# Patient Record
Sex: Female | Born: 1945 | Race: White | Hispanic: No | Marital: Married | Smoking: Never smoker
Health system: Southern US, Community
[De-identification: ages and names within clinical notes are randomized; demographics above are authoritative.]

## PROBLEM LIST (undated history)

## (undated) DIAGNOSIS — M1990 Unspecified osteoarthritis, unspecified site: Secondary | ICD-10-CM

## (undated) DIAGNOSIS — I482 Chronic atrial fibrillation, unspecified: Secondary | ICD-10-CM

## (undated) DIAGNOSIS — E790 Hyperuricemia without signs of inflammatory arthritis and tophaceous disease: Secondary | ICD-10-CM

## (undated) DIAGNOSIS — I10 Essential (primary) hypertension: Secondary | ICD-10-CM

## (undated) DIAGNOSIS — E6609 Other obesity due to excess calories: Secondary | ICD-10-CM

## (undated) DIAGNOSIS — E785 Hyperlipidemia, unspecified: Secondary | ICD-10-CM

## (undated) DIAGNOSIS — E039 Hypothyroidism, unspecified: Secondary | ICD-10-CM

## (undated) DIAGNOSIS — I809 Phlebitis and thrombophlebitis of unspecified site: Secondary | ICD-10-CM

## (undated) HISTORY — DX: Phlebitis and thrombophlebitis of unspecified site: I80.9

## (undated) HISTORY — DX: Other obesity due to excess calories: E66.09

## (undated) HISTORY — DX: Essential (primary) hypertension: I10

## (undated) HISTORY — DX: Unspecified osteoarthritis, unspecified site: M19.90

## (undated) HISTORY — DX: Chronic atrial fibrillation, unspecified: I48.20

## (undated) HISTORY — DX: Hyperlipidemia, unspecified: E78.5

## (undated) HISTORY — DX: Hyperuricemia without signs of inflammatory arthritis and tophaceous disease: E79.0

## (undated) HISTORY — DX: Hypothyroidism, unspecified: E03.9

---

## 1974-08-09 HISTORY — PX: OOPHORECTOMY: SHX86

## 1980-08-09 HISTORY — PX: TUBAL LIGATION: SHX77

## 1987-08-10 DIAGNOSIS — I8290 Acute embolism and thrombosis of unspecified vein: Secondary | ICD-10-CM

## 1987-08-10 HISTORY — DX: Acute embolism and thrombosis of unspecified vein: I82.90

## 1998-06-13 ENCOUNTER — Other Ambulatory Visit: Admission: RE | Admit: 1998-06-13 | Discharge: 1998-06-13 | Payer: Self-pay | Admitting: Obstetrics and Gynecology

## 1998-08-26 ENCOUNTER — Ambulatory Visit (HOSPITAL_COMMUNITY): Admission: RE | Admit: 1998-08-26 | Discharge: 1998-08-26 | Payer: Self-pay | Admitting: Obstetrics and Gynecology

## 1999-06-05 ENCOUNTER — Encounter: Payer: Self-pay | Admitting: Obstetrics and Gynecology

## 1999-06-05 ENCOUNTER — Encounter: Admission: RE | Admit: 1999-06-05 | Discharge: 1999-06-05 | Payer: Self-pay | Admitting: Obstetrics and Gynecology

## 1999-08-12 ENCOUNTER — Other Ambulatory Visit: Admission: RE | Admit: 1999-08-12 | Discharge: 1999-08-12 | Payer: Self-pay | Admitting: Obstetrics and Gynecology

## 2000-06-07 ENCOUNTER — Encounter: Admission: RE | Admit: 2000-06-07 | Discharge: 2000-06-07 | Payer: Self-pay | Admitting: Obstetrics and Gynecology

## 2000-06-07 ENCOUNTER — Encounter: Payer: Self-pay | Admitting: Obstetrics and Gynecology

## 2000-07-07 ENCOUNTER — Other Ambulatory Visit: Admission: RE | Admit: 2000-07-07 | Discharge: 2000-07-07 | Payer: Self-pay | Admitting: Obstetrics and Gynecology

## 2001-05-09 ENCOUNTER — Other Ambulatory Visit: Admission: RE | Admit: 2001-05-09 | Discharge: 2001-05-09 | Payer: Self-pay | Admitting: Obstetrics and Gynecology

## 2001-06-13 ENCOUNTER — Encounter: Payer: Self-pay | Admitting: Obstetrics and Gynecology

## 2001-06-13 ENCOUNTER — Encounter: Admission: RE | Admit: 2001-06-13 | Discharge: 2001-06-13 | Payer: Self-pay | Admitting: Obstetrics and Gynecology

## 2002-05-23 ENCOUNTER — Other Ambulatory Visit: Admission: RE | Admit: 2002-05-23 | Discharge: 2002-05-23 | Payer: Self-pay | Admitting: Gynecology

## 2002-09-17 ENCOUNTER — Encounter: Admission: RE | Admit: 2002-09-17 | Discharge: 2002-09-17 | Payer: Self-pay | Admitting: Gynecology

## 2002-09-17 ENCOUNTER — Encounter: Payer: Self-pay | Admitting: Gynecology

## 2002-09-28 ENCOUNTER — Encounter: Admission: RE | Admit: 2002-09-28 | Discharge: 2002-09-28 | Payer: Self-pay | Admitting: Gynecology

## 2002-09-28 ENCOUNTER — Encounter: Payer: Self-pay | Admitting: Gynecology

## 2003-05-22 ENCOUNTER — Other Ambulatory Visit: Admission: RE | Admit: 2003-05-22 | Discharge: 2003-05-22 | Payer: Self-pay | Admitting: Gynecology

## 2003-10-15 ENCOUNTER — Encounter: Admission: RE | Admit: 2003-10-15 | Discharge: 2003-10-15 | Payer: Self-pay | Admitting: Gynecology

## 2004-06-10 ENCOUNTER — Other Ambulatory Visit: Admission: RE | Admit: 2004-06-10 | Discharge: 2004-06-10 | Payer: Self-pay | Admitting: Gynecology

## 2004-10-21 ENCOUNTER — Encounter: Admission: RE | Admit: 2004-10-21 | Discharge: 2004-10-21 | Payer: Self-pay | Admitting: Gynecology

## 2004-11-04 ENCOUNTER — Encounter: Admission: RE | Admit: 2004-11-04 | Discharge: 2004-11-04 | Payer: Self-pay | Admitting: Gynecology

## 2005-07-07 ENCOUNTER — Other Ambulatory Visit: Admission: RE | Admit: 2005-07-07 | Discharge: 2005-07-07 | Payer: Self-pay | Admitting: Gynecology

## 2005-10-27 ENCOUNTER — Encounter: Admission: RE | Admit: 2005-10-27 | Discharge: 2005-10-27 | Payer: Self-pay | Admitting: Gynecology

## 2006-07-13 ENCOUNTER — Other Ambulatory Visit: Admission: RE | Admit: 2006-07-13 | Discharge: 2006-07-13 | Payer: Self-pay | Admitting: Gynecology

## 2006-11-02 ENCOUNTER — Encounter: Admission: RE | Admit: 2006-11-02 | Discharge: 2006-11-02 | Payer: Self-pay | Admitting: Gynecology

## 2007-03-22 ENCOUNTER — Encounter: Admission: RE | Admit: 2007-03-22 | Discharge: 2007-03-22 | Payer: Self-pay | Admitting: General Surgery

## 2007-03-28 ENCOUNTER — Encounter: Admission: RE | Admit: 2007-03-28 | Discharge: 2007-03-28 | Payer: Self-pay | Admitting: General Surgery

## 2007-07-05 ENCOUNTER — Encounter: Admission: RE | Admit: 2007-07-05 | Discharge: 2007-07-05 | Payer: Self-pay | Admitting: Interventional Radiology

## 2007-07-19 ENCOUNTER — Other Ambulatory Visit: Admission: RE | Admit: 2007-07-19 | Discharge: 2007-07-19 | Payer: Self-pay | Admitting: Gynecology

## 2007-11-08 ENCOUNTER — Encounter: Admission: RE | Admit: 2007-11-08 | Discharge: 2007-11-08 | Payer: Self-pay | Admitting: Gynecology

## 2008-11-27 ENCOUNTER — Encounter: Admission: RE | Admit: 2008-11-27 | Discharge: 2008-11-27 | Payer: Self-pay | Admitting: Gynecology

## 2009-08-09 HISTORY — PX: US ECHOCARDIOGRAPHY: HXRAD669

## 2009-12-03 ENCOUNTER — Encounter: Admission: RE | Admit: 2009-12-03 | Discharge: 2009-12-03 | Payer: Self-pay | Admitting: Gynecology

## 2010-03-13 ENCOUNTER — Ambulatory Visit: Payer: Self-pay | Admitting: Cardiology

## 2010-04-15 ENCOUNTER — Ambulatory Visit: Payer: Self-pay | Admitting: Cardiology

## 2010-05-13 ENCOUNTER — Ambulatory Visit: Payer: Self-pay | Admitting: Cardiology

## 2010-06-24 ENCOUNTER — Ambulatory Visit: Payer: Self-pay | Admitting: Cardiology

## 2010-07-24 ENCOUNTER — Ambulatory Visit: Payer: Self-pay | Admitting: Cardiology

## 2010-08-28 ENCOUNTER — Ambulatory Visit: Payer: Self-pay | Admitting: Cardiology

## 2010-08-30 ENCOUNTER — Encounter: Payer: Self-pay | Admitting: Gynecology

## 2010-08-30 ENCOUNTER — Encounter: Payer: Self-pay | Admitting: General Surgery

## 2010-09-23 ENCOUNTER — Ambulatory Visit (INDEPENDENT_AMBULATORY_CARE_PROVIDER_SITE_OTHER): Payer: Self-pay | Admitting: Cardiology

## 2010-09-23 DIAGNOSIS — Z7901 Long term (current) use of anticoagulants: Secondary | ICD-10-CM

## 2010-09-23 DIAGNOSIS — I4891 Unspecified atrial fibrillation: Secondary | ICD-10-CM

## 2010-10-21 ENCOUNTER — Other Ambulatory Visit (INDEPENDENT_AMBULATORY_CARE_PROVIDER_SITE_OTHER): Payer: BC Managed Care – PPO

## 2010-10-21 DIAGNOSIS — Z7901 Long term (current) use of anticoagulants: Secondary | ICD-10-CM

## 2010-10-21 DIAGNOSIS — I4891 Unspecified atrial fibrillation: Secondary | ICD-10-CM

## 2010-10-28 ENCOUNTER — Other Ambulatory Visit: Payer: Self-pay | Admitting: *Deleted

## 2010-10-28 DIAGNOSIS — F419 Anxiety disorder, unspecified: Secondary | ICD-10-CM

## 2010-10-28 MED ORDER — ALPRAZOLAM 0.5 MG PO TABS: 0.5000 mg | ORAL_TABLET | Freq: Three times a day (TID) | ORAL | Status: AC | PRN

## 2010-10-28 NOTE — Telephone Encounter (Signed)
Refilled meds per fax request.  

## 2010-11-02 ENCOUNTER — Other Ambulatory Visit: Payer: Self-pay | Admitting: Gynecology

## 2010-11-02 DIAGNOSIS — Z1231 Encounter for screening mammogram for malignant neoplasm of breast: Secondary | ICD-10-CM

## 2010-11-04 ENCOUNTER — Other Ambulatory Visit: Payer: Self-pay | Admitting: *Deleted

## 2010-11-04 MED ORDER — ALLOPURINOL 300 MG PO TABS: 300.0000 mg | ORAL_TABLET | Freq: Every day | ORAL | Status: DC

## 2010-11-04 NOTE — Telephone Encounter (Signed)
Refilled meds per fax request.  

## 2010-11-23 ENCOUNTER — Encounter: Payer: BC Managed Care – PPO | Admitting: *Deleted

## 2010-11-27 ENCOUNTER — Other Ambulatory Visit (INDEPENDENT_AMBULATORY_CARE_PROVIDER_SITE_OTHER): Payer: BC Managed Care – PPO | Admitting: *Deleted

## 2010-11-27 ENCOUNTER — Ambulatory Visit (INDEPENDENT_AMBULATORY_CARE_PROVIDER_SITE_OTHER): Payer: BC Managed Care – PPO | Admitting: Cardiology

## 2010-11-27 DIAGNOSIS — I4891 Unspecified atrial fibrillation: Secondary | ICD-10-CM

## 2010-12-03 ENCOUNTER — Other Ambulatory Visit: Payer: Self-pay | Admitting: Cardiology

## 2010-12-03 DIAGNOSIS — I4891 Unspecified atrial fibrillation: Secondary | ICD-10-CM

## 2010-12-03 NOTE — Telephone Encounter (Signed)
escribe request  

## 2010-12-07 ENCOUNTER — Ambulatory Visit: Payer: BC Managed Care – PPO

## 2010-12-17 ENCOUNTER — Ambulatory Visit
Admission: RE | Admit: 2010-12-17 | Discharge: 2010-12-17 | Disposition: A | Discharge status: Home / Self Care | Payer: BC Managed Care – PPO | Source: Ambulatory Visit | Attending: Gynecology | Admitting: Gynecology

## 2010-12-17 DIAGNOSIS — Z1231 Encounter for screening mammogram for malignant neoplasm of breast: Secondary | ICD-10-CM

## 2010-12-21 ENCOUNTER — Encounter: Payer: Self-pay | Admitting: *Deleted

## 2010-12-22 ENCOUNTER — Encounter: Payer: Self-pay | Admitting: Cardiology

## 2010-12-22 ENCOUNTER — Ambulatory Visit (INDEPENDENT_AMBULATORY_CARE_PROVIDER_SITE_OTHER): Payer: BC Managed Care – PPO | Admitting: Cardiology

## 2010-12-22 ENCOUNTER — Ambulatory Visit (INDEPENDENT_AMBULATORY_CARE_PROVIDER_SITE_OTHER): Payer: BC Managed Care – PPO | Admitting: *Deleted

## 2010-12-22 DIAGNOSIS — E785 Hyperlipidemia, unspecified: Secondary | ICD-10-CM

## 2010-12-22 DIAGNOSIS — M109 Gout, unspecified: Secondary | ICD-10-CM | POA: Insufficient documentation

## 2010-12-22 DIAGNOSIS — E039 Hypothyroidism, unspecified: Secondary | ICD-10-CM | POA: Insufficient documentation

## 2010-12-22 DIAGNOSIS — I4891 Unspecified atrial fibrillation: Secondary | ICD-10-CM

## 2010-12-22 DIAGNOSIS — I119 Hypertensive heart disease without heart failure: Secondary | ICD-10-CM

## 2010-12-22 NOTE — Assessment & Plan Note (Signed)
Patient has a history of hypothyroidism.  She is on replacement therapy with Synthroid 75 mcg daily.  She is clinically euthyroid.

## 2010-12-22 NOTE — Assessment & Plan Note (Signed)
Patient has a history of hypercholesterolemia she is on low dose Lipitor and she is tolerating it well.

## 2010-12-22 NOTE — Progress Notes (Signed)
Sharon Roy Date of Birth:  August 11, 1945 [facility] / [facility] [address]   [address] [place], [place]  [address] [phone]           Fax   [phone]  HPI: This pleasant 65 year old woman is seen for a scheduled followup office visit the patient has a history of chronic atrial fibrillation.  She is on long-term Coumadin.  She's had a history of essential hypertension and exogenous obesity.  She's had a history of hypothyroidism.  She's had a difficult time with losing weight she's had a history of dyslipidemia as well as hyperuricemia.  She also has a history of osteoarthritis.  Her last echocardiogram was 01/21/10 showing normal left ventricular systolic function with an ejection fraction of 55-60% mild aortic sclerosis and mild mitral regurgitation and normal pulmonary artery pressure.  Current Outpatient Prescriptions  Medication Sig Dispense Refill  . allopurinol (ZYLOPRIM) 300 MG tablet Take 1 tablet (300 mg total) by mouth daily.  60 tablet  5  . ALPRAZolam (XANAX) 0.5 MG tablet Take 0.5 mg by mouth as needed.        Marland Kitchen atorvastatin (LIPITOR) 40 MG tablet Take 40 mg by mouth daily.        . Calcium Carbonate-Vitamin D (OSCAL 500/200 D-3 PO) Take by mouth daily.        . Cetirizine HCl (ZYRTEC PO) Take by mouth daily.        . Cholecalciferol (VITAMIN D PO) Take 2,000 Units by mouth daily.        [name] Calcium (STOOL SOFTENER PO) Take by mouth daily.        . furosemide (LASIX) 40 MG tablet Take 40 mg by mouth as needed.        . hydrochlorothiazide 25 MG tablet Take 25 mg by mouth daily.        Marland Kitchen levothyroxine (SYNTHROID, LEVOTHROID) 75 MCG tablet Take 75 mcg by mouth daily.        . meclizine (ANTIVERT) 32 MG tablet Take 25 mg by mouth as needed.        . meloxicam (MOBIC) 15 MG tablet Take 15 mg by mouth daily.        . Multiple Vitamin (MULTIVITAMIN PO) Take by mouth daily.        . progesterone (PROMETRIUM) 200 MG capsule Take 200 mg by mouth every 3  (three) months.        . propranolol (INDERAL) 20 MG tablet TAKE 1 TABLET BY MOUTH EVERY DAY  90 tablet  3  . ramipril (ALTACE) 5 MG capsule Take 5 mg by mouth daily.        . solifenacin (VESICARE) 5 MG tablet Take 10 mg by mouth daily.        Marland Kitchen warfarin (COUMADIN) 5 MG tablet Take 5 mg by mouth daily.          Allergies  Allergen Reactions  . Allegra   . Penicillins     Patient Active Problem List  Diagnoses  . Atrial fibrillation  . 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 Hypothyroidism  . Dyslipidemia  . Gout    History  Smoking status  . Never Smoker   Smokeless tobacco  . Not on file    History  Alcohol Use: Not on file    No family history on file.  Review of Systems: The patient denies any heat or cold intolerance.  No weight gain or weight loss.  The  patient denies headaches or blurry vision.  There is no cough or sputum production.  The patient denies dizziness.  There is no hematuria or hematochezia.  The patient denies any muscle aches or arthritis.  The patient denies any rash.  The patient denies frequent falling or instability.  There is no history of depression or anxiety.  All other systems were reviewed and are negative.   Physical Exam: Filed Vitals:   12/22/10 1005  BP: 110/80  Pulse: 82  The general appearance reveals a large middle-aged woman in no acute distress.Pupils equal and reactive.   Extraocular Movements are full.  There is no scleral icterus.  The mouth and pharynx are normal.  The neck is supple.  The carotids reveal no bruits.  The jugular venous pressure is normal.  The thyroid is not enlarged.  There is no lymphadenopathy.The chest is clear to percussion and auscultation. There are no rales or rhonchi. Expansion of the chest is symmetrical.The precordium is quiet.  The first heart sound is normal.  The second heart sound is physiologically split.  There is no murmur gallop rub or click.  There is no abnormal lift or  heave. The rhythm is irregular.The abdomen is soft and nontender. Bowel sounds are normal. The liver and spleen are not enlarged. There Are no abdominal masses. There are no bruits.  Extremities show mild peripheral edema.  Pedal pulses are present.Strength is normal and symmetrical in all extremities.  There is no lateralizing weakness.  There are no sensory deficits.    Assessment / Plan: Her INR is therapeutic at 2.1 And she will continue same medication.Recheck at plan was for protime and we will see her in 3 months for office visit and EKG

## 2010-12-22 NOTE — Assessment & Plan Note (Signed)
The patient has a long history of atrial fibrillation.  She's been on chronic Coumadin anticoagulation.  She has not been expressing any thromboembolic symptoms.  She has not been having any symptoms of congestive heart failure.  She denies any chest pain or angina.  Her last echocardiogram was 01/19/10 and showed normal left ventricular systolic function with an ejection fraction of 55-60% and mild mitral regurgitation and normal pulmonary artery pressure.

## 2010-12-22 NOTE — Assessment & Plan Note (Signed)
The patient has a past history of gout.  She's had no recurrences since she went on allopurinol 300 mg daily.

## 2010-12-22 NOTE — Assessment & Plan Note (Signed)
Patient has a past history of essential hypertension and exogenous obesity.  She has a difficult time losing weight.  His not having any headaches or dizziness from her blood pressure.

## 2011-01-19 ENCOUNTER — Ambulatory Visit (INDEPENDENT_AMBULATORY_CARE_PROVIDER_SITE_OTHER): Payer: BC Managed Care – PPO | Admitting: *Deleted

## 2011-01-19 DIAGNOSIS — I4891 Unspecified atrial fibrillation: Secondary | ICD-10-CM

## 2011-02-23 ENCOUNTER — Encounter: Payer: BC Managed Care – PPO | Admitting: *Deleted

## 2011-03-01 ENCOUNTER — Other Ambulatory Visit: Payer: Self-pay | Admitting: *Deleted

## 2011-03-01 DIAGNOSIS — I4891 Unspecified atrial fibrillation: Secondary | ICD-10-CM

## 2011-03-01 MED ORDER — WARFARIN SODIUM 5 MG PO TABS: ORAL_TABLET | ORAL | Status: DC

## 2011-03-01 NOTE — Telephone Encounter (Signed)
Refilled meds per fax request.  

## 2011-03-04 ENCOUNTER — Ambulatory Visit (INDEPENDENT_AMBULATORY_CARE_PROVIDER_SITE_OTHER): Payer: BC Managed Care – PPO | Admitting: *Deleted

## 2011-03-04 DIAGNOSIS — I4891 Unspecified atrial fibrillation: Secondary | ICD-10-CM

## 2011-03-04 LAB — POCT INR: INR: 1.7

## 2011-03-23 ENCOUNTER — Ambulatory Visit (INDEPENDENT_AMBULATORY_CARE_PROVIDER_SITE_OTHER): Payer: BC Managed Care – PPO | Admitting: Cardiology

## 2011-03-23 ENCOUNTER — Encounter: Payer: BC Managed Care – PPO | Admitting: *Deleted

## 2011-03-23 VITALS — BP 130/80 | HR 88 | Ht 73.0 in | Wt 293.4 lb

## 2011-03-23 DIAGNOSIS — M109 Gout, unspecified: Secondary | ICD-10-CM

## 2011-03-23 DIAGNOSIS — I4891 Unspecified atrial fibrillation: Secondary | ICD-10-CM

## 2011-03-23 DIAGNOSIS — E785 Hyperlipidemia, unspecified: Secondary | ICD-10-CM

## 2011-03-23 NOTE — Progress Notes (Signed)
Sharon Roy Date of Birth:  1946-03-27 [facility] / [facility] [address]   [address] [place], [place]  [address] [phone]           Fax   [phone]  HPI: This pleasant 65 year old woman is seen for a scheduled followup office visit.  She has a past history of established chronic atrial fibrillation.  She also has essential hypertension and exogenous obesity.  She has a history of hypothyroidism and is on Synthroid.  She's had dyslipidemia as well as hyperuricemia with a past history of gout.  She has a history of osteoarthritis.  Since last visit she's had no new cardiac symptoms.  She denies chest pain or shortness of breath.  She's had no TIA symptoms from her atrial fibrillation.  She remains on Coumadin and his not having any complications or side effects from therapy.  Current Outpatient Prescriptions  Medication Sig Dispense Refill  . allopurinol (ZYLOPRIM) 300 MG tablet Take 1 tablet (300 mg total) by mouth daily.  60 tablet  5  . ALPRAZolam (XANAX) 0.5 MG tablet Take 0.5 mg by mouth as needed.        Marland Kitchen atorvastatin (LIPITOR) 10 MG tablet Take 10 mg by mouth daily.        . Calcium Carbonate-Vitamin D (OSCAL 500/200 D-3 PO) Take by mouth daily.        . Cetirizine HCl (ZYRTEC PO) Take by mouth daily.        . Cholecalciferol (VITAMIN D PO) Take 2,000 Units by mouth daily.        [name] Calcium (STOOL SOFTENER PO) Take by mouth daily.        Marland Kitchen estradiol (VIVELLE-DOT) 0.025 MG/24HR Place 1 patch onto the skin 2 (two) times a week.        . furosemide (LASIX) 40 MG tablet Take 40 mg by mouth as needed.        . hydrochlorothiazide 25 MG tablet Take 25 mg by mouth daily.        Marland Kitchen levothyroxine (SYNTHROID, LEVOTHROID) 75 MCG tablet Take 75 mcg by mouth daily.        . meloxicam (MOBIC) 15 MG tablet Take 15 mg by mouth daily.        . Multiple Vitamin (MULTIVITAMIN PO) Take by mouth daily.        . progesterone (PROMETRIUM) 200 MG capsule Take 200 mg by  mouth every 3 (three) months.        . propranolol (INDERAL) 20 MG tablet TAKE 1 TABLET BY MOUTH EVERY DAY  90 tablet  3  . ramipril (ALTACE) 5 MG capsule Take 5 mg by mouth daily.        . solifenacin (VESICARE) 5 MG tablet Take 5 mg by mouth daily.       Marland Kitchen warfarin (COUMADIN) 5 MG tablet 1 daily except 1 1/2 on Tuesday and Thursday or as directed  90 tablet  11    Allergies  Allergen Reactions  . Allegra   . Penicillins     Patient Active Problem List  Diagnoses  . Atrial fibrillation  . 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 Hypothyroidism  . Dyslipidemia  . Gout    History  Smoking status  . Never Smoker   Smokeless tobacco  . Not on file    History  Alcohol Use: Not on file    No family history on file.  Review of Systems: The patient denies  any heat or cold intolerance.  No weight gain or weight loss.  The patient denies headaches or blurry vision.  There is no cough or sputum production.  The patient denies dizziness.  There is no hematuria or hematochezia.  The patient denies any muscle aches or arthritis.  The patient denies any rash.  The patient denies frequent falling or instability.  There is no history of depression or anxiety.  All other systems were reviewed and are negative.   Physical Exam: Filed Vitals:   03/23/11 0944  BP: 130/80  Pulse: 88  The general appearance reveals a large woman in no acute distress.Pupils equal and reactive.   Extraocular Movements are full.  There is no scleral icterus.  The mouth and pharynx are normal.  The neck is supple.  The carotids reveal no bruits.  The jugular venous pressure is normal.  The thyroid is  enlarged.  There is no lymphadenopathy.The chest is clear to percussion and auscultation. There are no rales or rhonchi. Expansion of the chest is symmetrical.The patient has kyphosis.  The heart reveals an irregular rhythm consistent with atrial fibrillation.  There is no murmur gallop rub or  click.The abdomen is soft and nontender. Bowel sounds are normal. The liver and spleen are not enlarged. There Are no abdominal masses. There are no bruits.    Extremities reveal mild peripheral edema.  No phlebitis.Strength is normal and symmetrical in all extremities.  There is no lateralizing weakness.  There are no sensory deficits.  The skin is warm and dry.  There is no rash.  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  Recheck in 3 months for followup office visit.

## 2011-03-23 NOTE — Assessment & Plan Note (Signed)
The patient has a past history of acute gout.  She is on allopurinol 300 mg daily.  She has not had any recent flareups of gout.

## 2011-03-23 NOTE — Assessment & Plan Note (Signed)
The patient has not had any newCardiac symptoms.She's not having any symptoms of congestive heart failure.  Her last echocardiogram was 01/19/10 and showed normal left systolic function with an ejection fraction of 55-60% and mild mitral regurgitation and normal pulmonary artery pressure.

## 2011-03-23 NOTE — Assessment & Plan Note (Signed)
The patient remains on low dose Lipitor for her Hypercholesterolemia.  She has not been experiencing any myalgias from the statin therapy.

## 2011-03-25 ENCOUNTER — Ambulatory Visit (INDEPENDENT_AMBULATORY_CARE_PROVIDER_SITE_OTHER): Payer: BC Managed Care – PPO | Admitting: *Deleted

## 2011-03-25 DIAGNOSIS — I4891 Unspecified atrial fibrillation: Secondary | ICD-10-CM

## 2011-03-25 LAB — POCT INR: INR: 2.8

## 2011-04-14 ENCOUNTER — Other Ambulatory Visit: Payer: Self-pay | Admitting: *Deleted

## 2011-04-14 DIAGNOSIS — I119 Hypertensive heart disease without heart failure: Secondary | ICD-10-CM

## 2011-04-14 MED ORDER — HYDROCHLOROTHIAZIDE 25 MG PO TABS: 25.0000 mg | ORAL_TABLET | Freq: Every day | ORAL | Status: DC

## 2011-04-14 NOTE — Telephone Encounter (Signed)
Refilled meds per fax request.  

## 2011-04-22 ENCOUNTER — Ambulatory Visit (INDEPENDENT_AMBULATORY_CARE_PROVIDER_SITE_OTHER): Payer: BC Managed Care – PPO | Admitting: *Deleted

## 2011-04-22 DIAGNOSIS — I4891 Unspecified atrial fibrillation: Secondary | ICD-10-CM

## 2011-05-17 ENCOUNTER — Other Ambulatory Visit: Payer: Self-pay | Admitting: *Deleted

## 2011-05-17 MED ORDER — RAMIPRIL 5 MG PO CAPS: 5.0000 mg | ORAL_CAPSULE | Freq: Every day | ORAL | Status: DC

## 2011-05-20 ENCOUNTER — Ambulatory Visit (INDEPENDENT_AMBULATORY_CARE_PROVIDER_SITE_OTHER): Payer: BC Managed Care – PPO | Admitting: *Deleted

## 2011-05-20 DIAGNOSIS — I4891 Unspecified atrial fibrillation: Secondary | ICD-10-CM

## 2011-05-20 LAB — POCT INR: INR: 2.4

## 2011-05-21 ENCOUNTER — Other Ambulatory Visit: Payer: Self-pay | Admitting: Cardiology

## 2011-05-25 ENCOUNTER — Other Ambulatory Visit: Payer: Self-pay | Admitting: *Deleted

## 2011-05-25 DIAGNOSIS — F419 Anxiety disorder, unspecified: Secondary | ICD-10-CM

## 2011-05-25 MED ORDER — ALPRAZOLAM 0.5 MG PO TABS: 0.5000 mg | ORAL_TABLET | Freq: Three times a day (TID) | ORAL | Status: DC | PRN

## 2011-05-25 NOTE — Telephone Encounter (Signed)
Received refill request from pharmacy for alprazolam .5 mg three times a day instead of daily.  Ok per  Dr. Patty Sermons

## 2011-05-27 ENCOUNTER — Other Ambulatory Visit: Payer: Self-pay | Admitting: *Deleted

## 2011-05-27 MED ORDER — FUROSEMIDE 40 MG PO TABS: 40.0000 mg | ORAL_TABLET | ORAL | Status: DC | PRN

## 2011-06-23 ENCOUNTER — Ambulatory Visit (INDEPENDENT_AMBULATORY_CARE_PROVIDER_SITE_OTHER): Payer: BC Managed Care – PPO | Admitting: *Deleted

## 2011-06-23 ENCOUNTER — Ambulatory Visit (INDEPENDENT_AMBULATORY_CARE_PROVIDER_SITE_OTHER): Payer: BC Managed Care – PPO | Admitting: Cardiology

## 2011-06-23 ENCOUNTER — Encounter: Payer: Self-pay | Admitting: Cardiology

## 2011-06-23 VITALS — BP 130/88 | HR 76 | Ht 73.0 in | Wt 298.0 lb

## 2011-06-23 DIAGNOSIS — I4891 Unspecified atrial fibrillation: Secondary | ICD-10-CM

## 2011-06-23 DIAGNOSIS — I119 Hypertensive heart disease without heart failure: Secondary | ICD-10-CM

## 2011-06-23 DIAGNOSIS — M109 Gout, unspecified: Secondary | ICD-10-CM

## 2011-06-23 LAB — POCT INR: INR: 3.1

## 2011-06-23 NOTE — Assessment & Plan Note (Signed)
The patient has had no thromboembolic episodes.  She has not had any problems from the Coumadin.  No excessive bruising or bleeding

## 2011-06-23 NOTE — Assessment & Plan Note (Signed)
The patient has not had any flareup of gout. 

## 2011-06-23 NOTE — Assessment & Plan Note (Signed)
She is now having symptoms of congestive heart failure.  He does have some exertional dyspnea related to her exogenous obesity

## 2011-06-23 NOTE — Patient Instructions (Signed)
Your physician recommends that you continue on your current medications as directed. Please refer to the Current Medication list given to you today.  Your physician recommends that you schedule a follow-up appointment in: 3 months  

## 2011-06-23 NOTE — Progress Notes (Signed)
Sharon Roy Date of Birth:  10/23/1945 [facility] / [facility] [address]   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  She has a history of established chronic atrial fibrillation.  She also has a remote history of DVT.  She has high blood pressure and exogenous obesity.  She has hypothyroidism and is on Synthroid and she has a history of dyslipidemia.  She has a history of gout as well as osteoarthritis.  Current Outpatient Prescriptions  Medication Sig Dispense Refill  . allopurinol (ZYLOPRIM) 300 MG tablet Take 1 tablet (300 mg total) by mouth daily.  60 tablet  5  . ALPRAZolam (XANAX) 0.5 MG tablet Take 1 tablet (0.5 mg total) by mouth 3 (three) times daily as needed.  90 tablet  3  . atorvastatin (LIPITOR) 10 MG tablet TAKE 1 TABLET BY MOUTH EVERY DAY  90 tablet  3  . Calcium Carbonate-Vitamin D (OSCAL 500/200 D-3 PO) Take by mouth daily.        . Cetirizine HCl (ZYRTEC PO) Take by mouth daily.        . Cholecalciferol (VITAMIN D PO) Take 2,000 Units by mouth daily.        [name] Calcium (STOOL SOFTENER PO) Take by mouth daily.        Marland Kitchen estradiol (VIVELLE-DOT) 0.025 MG/24HR Place 1 patch onto the skin 2 (two) times a week.        . furosemide (LASIX) 40 MG tablet Take 1 tablet (40 mg total) by mouth as needed.  90 tablet  3  . hydrochlorothiazide 25 MG tablet Take 1 tablet (25 mg total) by mouth daily.  90 tablet  3  . levothyroxine (SYNTHROID, LEVOTHROID) 75 MCG tablet Take 75 mcg by mouth daily.        . meloxicam (MOBIC) 15 MG tablet Take 15 mg by mouth daily.        . Multiple Vitamin (MULTIVITAMIN PO) Take by mouth daily.        . progesterone (PROMETRIUM) 200 MG capsule Take 200 mg by mouth every 3 (three) months.        . propranolol (INDERAL) 20 MG tablet TAKE 1 TABLET BY MOUTH EVERY DAY   90 tablet  3  . ramipril (ALTACE) 5 MG capsule Take 1 capsule (5 mg total) by mouth daily.  90 capsule  3  . solifenacin (VESICARE) 5 MG tablet Take 5 mg by mouth daily.       Marland Kitchen warfarin (COUMADIN) 5 MG tablet 1 daily except 1 1/2 on Tuesday and Thursday or as directed  90 tablet  11  . meclizine (ANTIVERT) 25 MG tablet Take 25 mg by mouth as needed.        Allergies  Allergen Reactions  . Allegra   . Penicillins     Patient Active Problem List  Diagnoses  . Atrial fibrillation  . 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 Hypothyroidism  . Dyslipidemia  . Gout    History  Smoking status  . Never Smoker   Smokeless tobacco  . Not on file    History  Alcohol Use: Not on file    No family history on file.  Review of Systems: Constitutional: no fever chills diaphoresis or fatigue or change in weight.  Head and neck: no hearing loss, no epistaxis, no photophobia or visual disturbance. Respiratory: No cough, shortness of breath or wheezing. Cardiovascular: No chest pain peripheral edema, palpitations. Gastrointestinal: No abdominal distention, no abdominal pain, no change in bowel habits hematochezia or melena. Genitourinary: No dysuria, no frequency, no urgency, no nocturia. Musculoskeletal:No arthralgias, no back pain, no gait disturbance or myalgias. Neurological: No dizziness, no headaches, no numbness, no seizures, no syncope, no weakness, no tremors. Hematologic: No lymphadenopathy, no easy bruising. Psychiatric: No confusion, no hallucinations, no sleep disturbance.    Physical Exam: Filed Vitals:   06/23/11 0914  BP: 130/88  Pulse: 76   General appearance reveals a large woman who has kyphosis.Pupils equal and reactive.   Extraocular Movements are full.  There is no scleral icterus.  The mouth and pharynx are normal.  The neck is supple.  The carotids reveal no bruits.  The jugular venous pressure is normal.  The thyroid is not enlarged.   There is no lymphadenopathy.  The chest is clear to percussion and auscultation. There are no rales or rhonchi. Expansion of the chest is symmetrical.  Heart reveals an irregular rhythm.  No murmur gallop or rub.The abdomen is soft and nontender. Bowel sounds are normal. The liver and spleen are not enlarged. There Are no abdominal masses. There are no bruits.  Extremities reveal trace pretibial and pedal edemaStrength is normal and symmetrical in all extremities.  There is no lateralizing weakness.  There are no sensory deficits.  The skin is warm and dry.  There is no rash.   Assessment / Plan: Continue same medication.  Recheck 3 months

## 2011-07-14 ENCOUNTER — Ambulatory Visit (INDEPENDENT_AMBULATORY_CARE_PROVIDER_SITE_OTHER): Payer: BC Managed Care – PPO | Admitting: *Deleted

## 2011-07-14 DIAGNOSIS — I4891 Unspecified atrial fibrillation: Secondary | ICD-10-CM

## 2011-08-11 ENCOUNTER — Ambulatory Visit (INDEPENDENT_AMBULATORY_CARE_PROVIDER_SITE_OTHER): Payer: BC Managed Care – PPO | Admitting: *Deleted

## 2011-08-11 DIAGNOSIS — I4891 Unspecified atrial fibrillation: Secondary | ICD-10-CM

## 2011-09-08 ENCOUNTER — Ambulatory Visit (INDEPENDENT_AMBULATORY_CARE_PROVIDER_SITE_OTHER): Payer: BC Managed Care – PPO | Admitting: *Deleted

## 2011-09-08 DIAGNOSIS — I4891 Unspecified atrial fibrillation: Secondary | ICD-10-CM

## 2011-09-08 LAB — POCT INR: INR: 2.1

## 2011-09-23 ENCOUNTER — Ambulatory Visit (INDEPENDENT_AMBULATORY_CARE_PROVIDER_SITE_OTHER): Payer: BC Managed Care – PPO | Admitting: Cardiology

## 2011-09-23 VITALS — BP 128/88 | HR 78 | Ht 73.0 in | Wt 302.4 lb

## 2011-09-23 DIAGNOSIS — E039 Hypothyroidism, unspecified: Secondary | ICD-10-CM

## 2011-09-23 DIAGNOSIS — I4891 Unspecified atrial fibrillation: Secondary | ICD-10-CM

## 2011-09-23 DIAGNOSIS — I119 Hypertensive heart disease without heart failure: Secondary | ICD-10-CM

## 2011-09-23 NOTE — Assessment & Plan Note (Signed)
He denies any exertional chest pain or angina.  She has not had an ischemic testing.  Her echocardiogram on 01/19/10 showed normal left ventricular systolic no segmental wall motion abnormalities and an ejection fraction of 55-60%

## 2011-09-23 NOTE — Assessment & Plan Note (Signed)
She remains on long-term Coumadin without any difficulty.  She's had no TIA symptoms.

## 2011-09-23 NOTE — Patient Instructions (Signed)
Watch your carbohydrate intake and work on weight loss Your physician recommends that you continue on your current medications as directed. Please refer to the Current Medication list given to you today. Your physician recommends that you schedule a follow-up appointment in: 3 months with fasting labs (LP/BMET/HFP) and EKG

## 2011-09-23 NOTE — Progress Notes (Signed)
Sharon Roy Date of Birth:  08/09/1946 [facility] [address] [place], [place]  [address] [phone]  Fax   [phone]  HPI: This pleasant 66 year old woman is seen for a scheduled followup office visit.  She has a history of chronic atrial fibrillation.  She is at exogenous obesity problems in a problem with high blood pressure and dyslipidemia.  She is on Synthroid for hypothyroidism.  She also has a past history of gout and osteoarthritis.  Since last visit she's had no new cardiovascular symptoms.  She has been more short of breath which she attributes to a 4 pound weight gain.  She's had no flareup of gout.  Current Outpatient Prescriptions  Medication Sig Dispense Refill  . allopurinol (ZYLOPRIM) 300 MG tablet Take 1 tablet (300 mg total) by mouth daily.  60 tablet  5  . ALPRAZolam (XANAX) 0.5 MG tablet Take 1 tablet (0.5 mg total) by mouth 3 (three) times daily as needed.  90 tablet  3  . atorvastatin (LIPITOR) 10 MG tablet TAKE 1 TABLET BY MOUTH EVERY DAY  90 tablet  3  . Calcium Carbonate-Vitamin D (OSCAL 500/200 D-3 PO) Take by mouth daily.        . Cetirizine HCl (ZYRTEC PO) Take by mouth daily.        . Cholecalciferol (VITAMIN D PO) Take 2,000 Units by mouth daily.        [name] Calcium (STOOL SOFTENER PO) Take by mouth daily.        . furosemide (LASIX) 40 MG tablet Take 1 tablet (40 mg total) by mouth as needed.  90 tablet  3  . hydrochlorothiazide 25 MG tablet Take 1 tablet (25 mg total) by mouth daily.  90 tablet  3  . levothyroxine (SYNTHROID, LEVOTHROID) 75 MCG tablet Take 75 mcg by mouth daily.        . meclizine (ANTIVERT) 25 MG tablet Take 25 mg by mouth as needed.      . meloxicam (MOBIC) 15 MG tablet Take 15 mg by mouth daily.        . Multiple Vitamin (MULTIVITAMIN PO) Take by mouth daily.        . propranolol (INDERAL) 20 MG tablet TAKE 1 TABLET BY MOUTH EVERY DAY  90 tablet  3  . ramipril (ALTACE) 5 MG capsule Take 1 capsule (5  mg total) by mouth daily.  90 capsule  3  . solifenacin (VESICARE) 5 MG tablet Take 5 mg by mouth daily.       Marland Kitchen warfarin (COUMADIN) 5 MG tablet 1 daily except 1 1/2 on Tuesday and Thursday or as directed  90 tablet  11    Allergies  Allergen Reactions  . Allegra   . Penicillins     Patient Active Problem List  Diagnoses  . Atrial fibrillation  . 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 Hypothyroidism  . Dyslipidemia  . Gout    History  Smoking status  . Never Smoker   Smokeless tobacco  . Not on file    History  Alcohol Use: Not on file    No family history on file.  Review of Systems: The patient denies any heat or cold intolerance.  No weight gain or weight loss.  The patient denies headaches or blurry vision.  There is no cough or sputum production.  The patient denies dizziness.  There is no hematuria or hematochezia.  The patient denies any muscle aches or arthritis.  The patient denies any rash.  The patient denies frequent falling or instability.  There is no history of depression or anxiety.  All other systems were reviewed and are negative.   Physical Exam: Filed Vitals:   09/23/11 0850  BP: 128/88  Pulse: 78   the general appearance reveals a large woman in no distress.  He has significant kyphosis of her thoracic spine.Pupils equal and reactive.   Extraocular Movements are full.  There is no scleral icterus.  The mouth and pharynx are normal.  The neck is supple.  The carotids reveal no bruits.  The jugular venous pressure is normal.  The thyroid is  enlarged.  There is no lymphadenopathy.  He does have a moderate smooth The chest is clear to percussion and auscultation. There are no rales or rhonchi. Expansion of the chest is symmetrical.  The precordium is quiet.  The first heart sound is normal.  The second heart sound is physiologically split.  There is no murmur gallop rub or click.  There is no abnormal lift or heave.  Rhythm is  irregularThe abdomen is soft and nontender. Bowel sounds are normal. The liver and spleen are not enlarged. There Are no abdominal masses. There are no bruits.  The pedal pulses are good.  There is no phlebitis or edema.  There is no cyanosis or clubbing. Strength is normal and symmetrical in all extremities.  There is no lateralizing weakness.  There are no sensory deficits.  The skin is warm and dry.  There is no rash.         Assessment / Plan: Continue same medication.  Muscles weight.  We talked to her about a low carbohydrate diet today.  Recheck in 3 months for office visit EKG and fasting lab work.  She mentioned to [place] that she had had the shingles shot since last visit.

## 2011-10-06 ENCOUNTER — Ambulatory Visit (INDEPENDENT_AMBULATORY_CARE_PROVIDER_SITE_OTHER): Payer: BC Managed Care – PPO | Admitting: *Deleted

## 2011-10-06 DIAGNOSIS — I4891 Unspecified atrial fibrillation: Secondary | ICD-10-CM

## 2011-10-06 LAB — POCT INR: INR: 1.9

## 2011-11-03 ENCOUNTER — Ambulatory Visit (INDEPENDENT_AMBULATORY_CARE_PROVIDER_SITE_OTHER): Payer: BC Managed Care – PPO | Admitting: Pharmacist

## 2011-11-03 DIAGNOSIS — Z7901 Long term (current) use of anticoagulants: Secondary | ICD-10-CM

## 2011-11-03 DIAGNOSIS — I4891 Unspecified atrial fibrillation: Secondary | ICD-10-CM

## 2011-11-18 ENCOUNTER — Other Ambulatory Visit: Payer: Self-pay | Admitting: Gynecology

## 2011-11-18 DIAGNOSIS — Z1231 Encounter for screening mammogram for malignant neoplasm of breast: Secondary | ICD-10-CM

## 2011-12-01 ENCOUNTER — Ambulatory Visit (INDEPENDENT_AMBULATORY_CARE_PROVIDER_SITE_OTHER): Payer: BC Managed Care – PPO | Admitting: Pharmacist

## 2011-12-01 DIAGNOSIS — I4891 Unspecified atrial fibrillation: Secondary | ICD-10-CM

## 2011-12-01 DIAGNOSIS — Z7901 Long term (current) use of anticoagulants: Secondary | ICD-10-CM

## 2011-12-01 LAB — POCT INR: INR: 2.6

## 2011-12-06 ENCOUNTER — Other Ambulatory Visit: Payer: Self-pay | Admitting: Cardiology

## 2011-12-06 ENCOUNTER — Other Ambulatory Visit: Payer: Self-pay

## 2011-12-06 ENCOUNTER — Other Ambulatory Visit: Payer: Self-pay | Admitting: *Deleted

## 2011-12-06 DIAGNOSIS — F419 Anxiety disorder, unspecified: Secondary | ICD-10-CM

## 2011-12-06 MED ORDER — ALPRAZOLAM 0.5 MG PO TABS: 0.5000 mg | ORAL_TABLET | Freq: Three times a day (TID) | ORAL | Status: DC | PRN

## 2011-12-06 MED ORDER — ALLOPURINOL 300 MG PO TABS: 300.0000 mg | ORAL_TABLET | Freq: Every day | ORAL | Status: DC

## 2011-12-06 NOTE — Telephone Encounter (Signed)
Refilled alprazolam one time 

## 2011-12-13 ENCOUNTER — Ambulatory Visit (INDEPENDENT_AMBULATORY_CARE_PROVIDER_SITE_OTHER): Payer: BC Managed Care – PPO | Admitting: Cardiology

## 2011-12-13 ENCOUNTER — Ambulatory Visit (INDEPENDENT_AMBULATORY_CARE_PROVIDER_SITE_OTHER): Payer: BC Managed Care – PPO | Admitting: *Deleted

## 2011-12-13 ENCOUNTER — Encounter: Payer: Self-pay | Admitting: Cardiology

## 2011-12-13 VITALS — BP 138/86 | Ht 73.0 in | Wt 289.0 lb

## 2011-12-13 DIAGNOSIS — I119 Hypertensive heart disease without heart failure: Secondary | ICD-10-CM

## 2011-12-13 DIAGNOSIS — E785 Hyperlipidemia, unspecified: Secondary | ICD-10-CM

## 2011-12-13 DIAGNOSIS — I4891 Unspecified atrial fibrillation: Secondary | ICD-10-CM

## 2011-12-13 DIAGNOSIS — R609 Edema, unspecified: Secondary | ICD-10-CM

## 2011-12-13 DIAGNOSIS — E039 Hypothyroidism, unspecified: Secondary | ICD-10-CM

## 2011-12-13 LAB — BASIC METABOLIC PANEL
BUN: 18 mg/dL (ref 6–23)
Chloride: 100 mEq/L (ref 96–112)
Glucose, Bld: 114 mg/dL — ABNORMAL HIGH (ref 70–99)
Potassium: 3.3 mEq/L — ABNORMAL LOW (ref 3.5–5.1)

## 2011-12-13 LAB — HEPATIC FUNCTION PANEL
ALT: 34 U/L (ref 0–35)
AST: 33 U/L (ref 0–37)
Albumin: 4.1 g/dL (ref 3.5–5.2)
Total Protein: 7.1 g/dL (ref 6.0–8.3)

## 2011-12-13 LAB — LIPID PANEL
Cholesterol: 112 mg/dL (ref 0–200)
LDL Cholesterol: 63 mg/dL (ref 0–99)

## 2011-12-13 LAB — TSH: TSH: 0.37 u[IU]/mL (ref 0.35–5.50)

## 2011-12-13 NOTE — Progress Notes (Signed)
Sharon Roy Date of Birth:  [birth year] [facility] [address] [place], [place]  [address] [phone]  Fax   [phone]  HPI: This pleasant 66 year old woman is seen for a scheduled followup office visit.  She has a history of chronic atrial fibrillation.  She has a history of exogenous obesity.  She has hypothyroidism, high blood pressure, and dyslipidemia.  She also has a past history of remote gout and history of osteoarthritis.  He has not had any recent flareups of her gout.  She is on allopurinol.  Since last visit she has cut out soft drinks and her weight is down 13 pounds.  Current Outpatient Prescriptions  Medication Sig Dispense Refill  . allopurinol (ZYLOPRIM) 300 MG tablet Take 1 tablet (300 mg total) by mouth daily.  60 tablet  1  . ALPRAZolam (XANAX) 0.5 MG tablet Take 1 tablet (0.5 mg total) by mouth 3 (three) times daily as needed.  90 tablet  0  . atorvastatin (LIPITOR) 10 MG tablet TAKE 1 TABLET BY MOUTH EVERY DAY  90 tablet  3  . Calcium Carbonate-Vitamin D (OSCAL 500/200 D-3 PO) Take by mouth daily.        . Cetirizine HCl (ZYRTEC PO) Take by mouth daily.        . Cholecalciferol (VITAMIN D PO) Take 2,000 Units by mouth daily.        [name] Calcium (STOOL SOFTENER PO) Take by mouth daily.        . furosemide (LASIX) 40 MG tablet Take 1 tablet (40 mg total) by mouth as needed.  90 tablet  3  . hydrochlorothiazide 25 MG tablet Take 1 tablet (25 mg total) by mouth daily.  90 tablet  3  . levothyroxine (SYNTHROID, LEVOTHROID) 75 MCG tablet Take 75 mcg by mouth daily.        . meclizine (ANTIVERT) 25 MG tablet Take 25 mg by mouth as needed.      . meloxicam (MOBIC) 15 MG tablet Take 15 mg by mouth daily.        . Multiple Vitamin (MULTIVITAMIN PO) Take by mouth daily.        . propranolol (INDERAL) 20 MG tablet TAKE 1 TABLET BY MOUTH EVERY DAY  90 tablet  3  . ramipril (ALTACE) 5 MG capsule Take 1 capsule (5 mg total) by mouth daily.  90  capsule  3  . solifenacin (VESICARE) 5 MG tablet Take 5 mg by mouth daily.       Marland Kitchen warfarin (COUMADIN) 5 MG tablet 1 daily except 1 1/2 on Tuesday and Thursday or as directed  90 tablet  11  . nystatin-triamcinolone (MYCOLOG II) cream as needed.        Allergies  Allergen Reactions  . Fexofenadine Hcl   . Penicillins     Patient Active Problem List  Diagnoses  . Atrial fibrillation  . 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 Hypothyroidism  . Dyslipidemia  . Gout    History  Smoking status  . Never Smoker   Smokeless tobacco  . Not on file    History  Alcohol Use: Not on file    No family history on file.  Review of Systems: The patient denies any heat or cold intolerance.  No weight gain or weight loss.  The patient denies headaches or blurry vision.  There is no cough or sputum production.  The patient denies dizziness.  There is no hematuria or hematochezia.  The patient denies any muscle aches or arthritis.  The patient denies any rash.  The patient denies frequent falling or instability.  There is no history of depression or anxiety.  All other systems were reviewed and are negative.   Physical Exam: Filed Vitals:   12/13/11 0858  BP: 138/86   the general appearance reveals a well-developed well-nourished large woman in no distress.Pupils equal and reactive.   Extraocular Movements are full.  There is no scleral icterus.  The mouth and pharynx are normal.  The neck is supple.  The carotids reveal no bruits.  The jugular venous pressure is normal.  The thyroid is not enlarged.  There is no lymphadenopathy.  The chest is clear to percussion and auscultation. There are no rales or rhonchi. Expansion of the chest is symmetrical.  The precordium is quiet.  The first heart sound is normal.  The second heart sound is physiologically split.  There is no murmur gallop rub or click.  There is no abnormal lift or heave.  The rhythm is irregular.  The  abdomen is soft and nontender. Bowel sounds are normal. The liver and spleen are not enlarged. There Are no abdominal masses. There are no bruits.The pedal pulses are good.  There is no phlebitis or edema.  There is no cyanosis or clubbing. Strength is normal and symmetrical in all extremities.  There is no lateralizing weakness.  There are no sensory deficits.  The skin is warm and dry.  There is no rash.       Assessment / Plan: Redrew blood work today including TSH.  The results are pending.  Continue same medication and be rechecked in 3 months for followup office visit and EKG.

## 2011-12-13 NOTE — Patient Instructions (Signed)
Your physician has recommended:   Stay on your current medications   You will return in 3 Months with an ekg performed

## 2011-12-13 NOTE — Assessment & Plan Note (Signed)
Patient has a history of hypothyroidism.  She is clinically euthyroid.  We're checking a TSH today.

## 2011-12-13 NOTE — Progress Notes (Signed)
Quick Note:  Please report to patient. The recent labs are stable. The potassium is too low from the HCTZ. Decrease the HCTZ to just half a tablet daily. Increase high potassium foods. ______

## 2011-12-13 NOTE — Assessment & Plan Note (Signed)
Patient has done better with her diet.  Her weight is down 13 pounds.  She will continue careful diet and we're checking blood work today.  She remains on low-dose Lipitor 10 mg daily

## 2011-12-13 NOTE — Assessment & Plan Note (Signed)
The patient has not been experiencing any pre-shortness of breath.  She continues to have chronic edema worse on the left than in the right foot she is doing better with her exercises.

## 2011-12-13 NOTE — Assessment & Plan Note (Signed)
The patient is on long-term Coumadin.  She has not been expressing any TIA symptoms.

## 2011-12-13 NOTE — Progress Notes (Signed)
Quick Note:  Please report to patient. The recent labs are stable. Continue same dose of thyroid. ______

## 2011-12-15 ENCOUNTER — Telehealth: Payer: Self-pay | Admitting: *Deleted

## 2011-12-15 NOTE — Telephone Encounter (Signed)
Advised of labs and med changes 

## 2011-12-15 NOTE — Telephone Encounter (Signed)
Message copied by Burnell Blanks on Wed Dec 15, 2011 11:48 AM ------      Message from: Cassell Clement      Created: Mon Dec 13, 2011  8:56 PM       Please report to patient.  The recent labs are stable. Continue same dose of thyroid.

## 2011-12-15 NOTE — Telephone Encounter (Signed)
Message copied by Burnell Blanks on Wed Dec 15, 2011 11:51 AM ------      Message from: Cassell Clement      Created: Mon Dec 13, 2011  8:58 PM       Please report to patient.  The recent labs are stable. The potassium is too low from the HCTZ.  Decrease the HCTZ to just half a tablet daily.  Increase high potassium foods.

## 2011-12-16 ENCOUNTER — Other Ambulatory Visit: Payer: Self-pay | Admitting: Cardiology

## 2011-12-22 ENCOUNTER — Ambulatory Visit
Admission: RE | Admit: 2011-12-22 | Discharge: 2011-12-22 | Disposition: A | Discharge status: Home / Self Care | Payer: BC Managed Care – PPO | Source: Ambulatory Visit | Attending: Gynecology | Admitting: Gynecology

## 2011-12-22 DIAGNOSIS — Z1231 Encounter for screening mammogram for malignant neoplasm of breast: Secondary | ICD-10-CM

## 2012-01-12 ENCOUNTER — Ambulatory Visit (INDEPENDENT_AMBULATORY_CARE_PROVIDER_SITE_OTHER): Payer: Medicare Other | Admitting: *Deleted

## 2012-01-12 DIAGNOSIS — Z7901 Long term (current) use of anticoagulants: Secondary | ICD-10-CM

## 2012-01-12 DIAGNOSIS — I4891 Unspecified atrial fibrillation: Secondary | ICD-10-CM

## 2012-01-20 ENCOUNTER — Other Ambulatory Visit (HOSPITAL_COMMUNITY): Payer: Self-pay | Admitting: *Deleted

## 2012-01-20 DIAGNOSIS — F419 Anxiety disorder, unspecified: Secondary | ICD-10-CM

## 2012-01-20 MED ORDER — ALPRAZOLAM 0.5 MG PO TABS: 0.5000 mg | ORAL_TABLET | Freq: Three times a day (TID) | ORAL | Status: DC | PRN

## 2012-01-20 NOTE — Telephone Encounter (Signed)
Refill on alprazolam 

## 2012-02-23 ENCOUNTER — Ambulatory Visit (INDEPENDENT_AMBULATORY_CARE_PROVIDER_SITE_OTHER): Payer: Medicare Other | Admitting: *Deleted

## 2012-02-23 DIAGNOSIS — Z7901 Long term (current) use of anticoagulants: Secondary | ICD-10-CM

## 2012-02-23 DIAGNOSIS — I4891 Unspecified atrial fibrillation: Secondary | ICD-10-CM

## 2012-03-15 ENCOUNTER — Ambulatory Visit (INDEPENDENT_AMBULATORY_CARE_PROVIDER_SITE_OTHER): Payer: BC Managed Care – PPO | Admitting: Cardiology

## 2012-03-15 ENCOUNTER — Encounter: Payer: Self-pay | Admitting: Cardiology

## 2012-03-15 ENCOUNTER — Other Ambulatory Visit: Payer: Self-pay | Admitting: *Deleted

## 2012-03-15 VITALS — BP 128/80 | HR 97 | Ht 73.0 in | Wt 299.0 lb

## 2012-03-15 DIAGNOSIS — I119 Hypertensive heart disease without heart failure: Secondary | ICD-10-CM

## 2012-03-15 DIAGNOSIS — L309 Dermatitis, unspecified: Secondary | ICD-10-CM

## 2012-03-15 DIAGNOSIS — F419 Anxiety disorder, unspecified: Secondary | ICD-10-CM

## 2012-03-15 DIAGNOSIS — L259 Unspecified contact dermatitis, unspecified cause: Secondary | ICD-10-CM

## 2012-03-15 DIAGNOSIS — E039 Hypothyroidism, unspecified: Secondary | ICD-10-CM

## 2012-03-15 DIAGNOSIS — I4891 Unspecified atrial fibrillation: Secondary | ICD-10-CM

## 2012-03-15 MED ORDER — ALPRAZOLAM 0.5 MG PO TABS: 0.5000 mg | ORAL_TABLET | Freq: Three times a day (TID) | ORAL | Status: DC | PRN

## 2012-03-15 NOTE — Assessment & Plan Note (Signed)
Since last visit she's had no flareup of gout.  She remains on allopurinol 300 mg daily

## 2012-03-15 NOTE — Assessment & Plan Note (Signed)
Patient has had no thromboembolic episodes.  She comes to the Coumadin clinic every 6 weeks.

## 2012-03-15 NOTE — Progress Notes (Signed)
Sharon Roy Date of Birth:  1946/04/28 [facility] [address] [place], [place]  [address] [phone]         Fax   [phone]  History of Present Illness: This pleasant 66 year old woman is seen for a scheduled 3 month followup office visit.  She has chronic atrial fibrillation and is on Coumadin.  She also has a history of dyslipidemia, hypertension, hypothyroidism, and gout.  Recently she has also developed a dermatitis  Current Outpatient Prescriptions  Medication Sig Dispense Refill  . allopurinol (ZYLOPRIM) 300 MG tablet Take 1 tablet (300 mg total) by mouth daily.  60 tablet  1  . ALPRAZolam (XANAX) 0.5 MG tablet Take 1 tablet (0.5 mg total) by mouth 3 (three) times daily as needed.  90 tablet  0  . atorvastatin (LIPITOR) 10 MG tablet TAKE 1 TABLET BY MOUTH EVERY DAY  90 tablet  3  . Calcium Carbonate-Vitamin D (OSCAL 500/200 D-3 PO) Take by mouth daily.        . Cetirizine HCl (ZYRTEC PO) Take by mouth daily.        . Cholecalciferol (VITAMIN D PO) Take 2,000 Units by mouth daily.        [name] Calcium (STOOL SOFTENER PO) Take by mouth daily.        . furosemide (LASIX) 40 MG tablet Take 1 tablet (40 mg total) by mouth as needed.  90 tablet  3  . hydrochlorothiazide (HYDRODIURIL) 25 MG tablet Take 25 mg by mouth. 1/2 tablet daily      . levothyroxine (SYNTHROID, LEVOTHROID) 75 MCG tablet Take 75 mcg by mouth daily.        . meclizine (ANTIVERT) 25 MG tablet Take 25 mg by mouth as needed.      . meloxicam (MOBIC) 15 MG tablet Take 15 mg by mouth daily.        . Multiple Vitamin (MULTIVITAMIN PO) Take by mouth daily.        Marland Kitchen nystatin-triamcinolone (MYCOLOG II) cream as needed.      Marland Kitchen oxybutynin (DITROPAN-XL) 10 MG 24 hr tablet Take 10 mg by mouth daily.      . propranolol (INDERAL) 20 MG tablet TAKE 1 TABLET BY MOUTH EVERY DAY  90 tablet  3  . ramipril (ALTACE) 5 MG capsule Take 1 capsule (5 mg total) by mouth daily.  90 capsule  3  . warfarin  (COUMADIN) 5 MG tablet 1 daily except 1 1/2 on Tuesday and Thursday or as directed  90 tablet  11    Allergies  Allergen Reactions  . Fexofenadine Hcl   . Penicillins     Patient Active Problem List  Diagnosis  . Atrial fibrillation  . 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 Hypothyroidism  . Dyslipidemia  . Gout    History  Smoking status  . Never Smoker   Smokeless tobacco  . Not on file    History  Alcohol Use: Not on file    No family history on file.  Review of Systems: Constitutional: no fever chills diaphoresis or fatigue or change in weight.  Head and neck: no hearing loss, no epistaxis, no photophobia or visual disturbance. Respiratory: No cough, shortness of breath or wheezing. Cardiovascular: No chest pain peripheral edema, palpitations. Gastrointestinal: No abdominal distention, no abdominal pain, no change in bowel habits hematochezia or melena. Genitourinary: No dysuria, no frequency, no urgency, no nocturia. Musculoskeletal:No arthralgias, no back pain, no gait disturbance or myalgias.  Neurological: No dizziness, no headaches, no numbness, no seizures, no syncope, no weakness, no tremors. Hematologic: No lymphadenopathy, no easy bruising. Psychiatric: No confusion, no hallucinations, no sleep disturbance.    Physical Exam: Filed Vitals:   03/15/12 0945  BP: 128/80  Pulse: 97   general appearance reveals a large woman in no acute distress.  Her weight has gone up 10 pounds since last visit.The head and neck exam reveals pupils equal and reactive.  Extraocular movements are full.  There is no scleral icterus.  The mouth and pharynx are normal.  The neck is supple.  The carotids reveal no bruits.  The jugular venous pressure is normal.  The  thyroid is not enlarged.  There is no lymphadenopathy.  The chest is clear to percussion and auscultation.  There are no rales or rhonchi.  Expansion of the chest is symmetrical.  The precordium is  quiet.  The first heart sound is normal.  The second heart sound is physiologically split.  There is no murmur gallop rub or click.  The rhythm is irregularly irregular  There is no abnormal lift or heave.  The abdomen is soft and nontender.  The bowel sounds are normal.  The liver and spleen are not enlarged.  There are no abdominal masses.  There are no abdominal bruits.  Extremities reveal good pedal pulses.  There is no phlebitis or edema.  There is no cyanosis or clubbing.  Strength is normal and symmetrical in all extremities.  There is no lateralizing weakness.  There are no sensory deficits.  The skin is warm and dry.  There is a dry scaly dermatitis rash which is nummular on her left breast and on her right wrist and on her ankles.  This appears to be a type of possible eczema and she will treated initially with over-the-counter hydrocortisone cream.  If she fails to have an adequate response she should see a dermatologist.   EKG today reveals atrial fibrillation with controlled ventricular response and with left axis deviation and and no ischemic changes  Assessment / Plan: Kidney same medication and be rechecked in 3 months for office visit CBC uric acid and lipid panel hepatic function panel nasal metabolic panel and TSH

## 2012-03-15 NOTE — Patient Instructions (Signed)
Your physician recommends that you continue on your current medications as directed. Please refer to the Current Medication list given to you today.  Your physician recommends that you schedule a follow-up appointment in: 3 months with fasting labs (LP/BMET/HFP/URIC ACID/CBC/TSH)

## 2012-03-15 NOTE — Telephone Encounter (Signed)
Refilled alprazolam 

## 2012-03-15 NOTE — Assessment & Plan Note (Signed)
The patient is clinically euthyroid on current therapy 

## 2012-03-15 NOTE — Assessment & Plan Note (Signed)
Her blood pressure is stable on current therapy.  She's having no dizzy spells or syncope.  No symptoms of CHF.

## 2012-03-16 ENCOUNTER — Other Ambulatory Visit: Payer: Self-pay | Admitting: *Deleted

## 2012-03-16 DIAGNOSIS — M109 Gout, unspecified: Secondary | ICD-10-CM

## 2012-04-17 ENCOUNTER — Telehealth: Payer: Self-pay | Admitting: Cardiology

## 2012-04-17 NOTE — Telephone Encounter (Signed)
VERBAL OKAY GIVEN FOR INR CHECK WILL CHECK Thursday AND FORWARD RESULTS .Zack Seal

## 2012-04-17 NOTE — Telephone Encounter (Signed)
Plz return call to Ivor Reining- RN [facility] [phone], she needs verbal order to ck pt coumadin in home.

## 2012-04-19 ENCOUNTER — Other Ambulatory Visit: Payer: Self-pay | Admitting: Cardiology

## 2012-04-20 ENCOUNTER — Telehealth: Payer: Self-pay | Admitting: *Deleted

## 2012-04-20 NOTE — Telephone Encounter (Signed)
Received a call from [facility] nurse.  Patient is very weak and feels bad.  Heart rate 127 and irregular, resp 24, blood pressure 120/80, and O2 sat 97%. Per nurse patient is so weak she has a hard time even getting up and going to restroom without extreme fatigue. INR today 7.1 After talking with nurse she felt as did I  patient needed to go to emergency room to be evaluated since she feels so bad and heart rate so irregular.  Nurse will not do a venipuncture today since patient is going to go to emergency room.

## 2012-05-29 ENCOUNTER — Other Ambulatory Visit: Payer: Self-pay | Admitting: Cardiology

## 2012-06-10 ENCOUNTER — Other Ambulatory Visit: Payer: Self-pay | Admitting: Cardiology

## 2012-06-15 ENCOUNTER — Encounter: Payer: Self-pay | Admitting: Cardiology

## 2012-06-15 ENCOUNTER — Other Ambulatory Visit (INDEPENDENT_AMBULATORY_CARE_PROVIDER_SITE_OTHER): Payer: Medicare Other

## 2012-06-15 ENCOUNTER — Telehealth: Payer: Self-pay | Admitting: *Deleted

## 2012-06-15 ENCOUNTER — Ambulatory Visit (INDEPENDENT_AMBULATORY_CARE_PROVIDER_SITE_OTHER): Payer: Medicare Other | Admitting: Cardiology

## 2012-06-15 VITALS — BP 130/74 | HR 19 | Resp 18 | Ht 73.0 in | Wt 277.4 lb

## 2012-06-15 DIAGNOSIS — D509 Iron deficiency anemia, unspecified: Secondary | ICD-10-CM

## 2012-06-15 DIAGNOSIS — I4891 Unspecified atrial fibrillation: Secondary | ICD-10-CM

## 2012-06-15 DIAGNOSIS — E039 Hypothyroidism, unspecified: Secondary | ICD-10-CM

## 2012-06-15 DIAGNOSIS — I119 Hypertensive heart disease without heart failure: Secondary | ICD-10-CM

## 2012-06-15 DIAGNOSIS — M109 Gout, unspecified: Secondary | ICD-10-CM

## 2012-06-15 LAB — HEPATIC FUNCTION PANEL
AST: 22 U/L (ref 0–37)
Total Bilirubin: 0.8 mg/dL (ref 0.3–1.2)

## 2012-06-15 LAB — BASIC METABOLIC PANEL
BUN: 10 mg/dL (ref 6–23)
Creatinine, Ser: 0.7 mg/dL (ref 0.4–1.2)
GFR: 87.71 mL/min (ref >60.00)
Potassium: 4 mEq/L (ref 3.5–5.1)

## 2012-06-15 LAB — LIPID PANEL
Cholesterol: 114 mg/dL (ref 0–200)
LDL Cholesterol: 67 mg/dL (ref 0–99)
VLDL: 15.4 mg/dL (ref 0.0–40.0)

## 2012-06-15 LAB — CBC WITH DIFFERENTIAL/PLATELET
Basophils Relative: 0.1 % (ref 0.0–3.0)
Eosinophils Relative: 2.7 % (ref 0.0–5.0)
HCT: 35.7 % — ABNORMAL LOW (ref 36.0–46.0)
Hemoglobin: 11.6 g/dL — ABNORMAL LOW (ref 12.0–15.0)
Lymphs Abs: 0.7 10*3/uL (ref 0.7–4.0)
Monocytes Relative: 8.3 % (ref 3.0–12.0)
Neutro Abs: 3.1 10*3/uL (ref 1.4–7.7)
WBC: 4.2 10*3/uL — ABNORMAL LOW (ref 4.5–10.5)

## 2012-06-15 LAB — TSH: TSH: 0.39 u[IU]/mL (ref 0.35–5.50)

## 2012-06-15 MED ORDER — WARFARIN SODIUM 5 MG PO TABS: ORAL_TABLET | ORAL | Status: DC

## 2012-06-15 NOTE — Progress Notes (Signed)
Sharon Roy Date of Birth:  Mar 07, 1946 [facility] [address] [place], [place]  [address] [phone]         Fax   [phone]  History of Present Illness: This pleasant 66 year old woman is seen for a scheduled 3 month followup office visit. She has chronic atrial fibrillation and is on Coumadin. She also has a history of dyslipidemia, hypertension, hypothyroidism, and gout.  Since last visit she has had a lot happen to her.  She was admitted to the hospital in [facility] for cellulitis of her lower extremities.  She has pre-existing severe lymphedema of both lower extremities.  She was treated with antibiotics and after discharge the home health nurse checked a home prothrombin time it was markedly elevated.  She was also found to be in rapid atrial fibrillation and had to be readmitted with a GI bleed manifesting as melena and she required a total of 5 units of packed cells blood transfusion.  She had upper and lower endoscopy by Dr. Teena Dunk, gastroenterologist, who found diverticulosis.  After her second discharge the patient developed vaginal bleeding and saw a gynecologist on an outpatient basis who removed a polyp from her uterus.  She has felt weak.  She has been off her warfarin since September 1.  She is on iron for iron deficiency anemia.   Current Outpatient Prescriptions  Medication Sig Dispense Refill  . allopurinol (ZYLOPRIM) 300 MG tablet TAKE 1 TABLET EVERY DAY  60 tablet  3  . ALPRAZolam (XANAX) 0.5 MG tablet Take 1 tablet (0.5 mg total) by mouth 3 (three) times daily as needed.  90 tablet  4  . atorvastatin (LIPITOR) 10 MG tablet TAKE 1 TABLET BY MOUTH EVERY DAY  90 tablet  3  . Cetirizine HCl (ZYRTEC PO) Take by mouth daily.        [name] Calcium (STOOL SOFTENER PO) Take by mouth daily.        . ferrous sulfate 325 (65 FE) MG tablet Take 325 mg by mouth 2 (two) times daily.      Marland Kitchen levothyroxine (SYNTHROID, LEVOTHROID) 75 MCG tablet Take 75 mcg by  mouth daily.        . meclizine (ANTIVERT) 25 MG tablet Take 25 mg by mouth as needed.      . metFORMIN (GLUCOPHAGE) 500 MG tablet Take 500 mg by mouth daily with breakfast.      . nystatin-triamcinolone (MYCOLOG II) cream as needed.      . propranolol (INDERAL) 20 MG tablet Take 20 mg by mouth 2 (two) times daily.       . ramipril (ALTACE) 5 MG capsule TAKE ONE CAPSULE BY MOUTH EVERY DAY  90 capsule  3  . [DISCONTINUED] propranolol (INDERAL) 20 MG tablet TAKE 1 TABLET BY MOUTH EVERY DAY  90 tablet  3  . Calcium Carbonate-Vitamin D (OSCAL 500/200 D-3 PO) Take by mouth daily.        . Cholecalciferol (VITAMIN D PO) Take 2,000 Units by mouth daily.        . furosemide (LASIX) 40 MG tablet Take 1 tablet (40 mg total) by mouth as needed.  90 tablet  3  . hydrochlorothiazide (HYDRODIURIL) 25 MG tablet Take 25 mg by mouth. 1/2 tablet daily      . meloxicam (MOBIC) 15 MG tablet Take 15 mg by mouth daily.        . Multiple Vitamin (MULTIVITAMIN PO) Take by mouth daily.        Marland Kitchen  oxybutynin (DITROPAN-XL) 10 MG 24 hr tablet Take 10 mg by mouth daily.      Marland Kitchen warfarin (COUMADIN) 5 MG tablet 1 daily except 1 1/2 on Tuesday and Thursday or as directed  90 tablet  11    Allergies  Allergen Reactions  . Fexofenadine Hcl   . Penicillins     Patient Active Problem List  Diagnosis  . Atrial fibrillation  . 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 Hypothyroidism  . Dyslipidemia  . Gout  . Iron deficiency anemia, unspecified    History  Smoking status  . Never Smoker   Smokeless tobacco  . Not on file    History  Alcohol Use: Not on file    No family history on file.  Review of Systems: Constitutional: no fever chills diaphoresis or fatigue or change in weight.  Head and neck: no hearing loss, no epistaxis, no photophobia or visual disturbance. Respiratory: No cough, shortness of breath or wheezing. Cardiovascular: No chest pain peripheral edema,  palpitations. Gastrointestinal: No abdominal distention, no abdominal pain, no change in bowel habits hematochezia or melena. Genitourinary: No dysuria, no frequency, no urgency, no nocturia. Musculoskeletal:No arthralgias, no back pain, no gait disturbance or myalgias. Neurological: No dizziness, no headaches, no numbness, no seizures, no syncope, no weakness, no tremors. Hematologic: No lymphadenopathy, no easy bruising. Psychiatric: No confusion, no hallucinations, no sleep disturbance.    Physical Exam: Filed Vitals:   06/15/12 0948  BP: 130/74  Pulse: 19  Resp: 18   the general appearance reveals out of a large middle-aged woman in no acute distress.  He appears pale.The head and neck exam reveals pupils equal and reactive.  Extraocular movements are full.  There is no scleral icterus.  The mouth and pharynx are normal.  The neck is supple.  The carotids reveal no bruits.  The jugular venous pressure is normal.  The  thyroid is not enlarged.  There is no lymphadenopathy.  The chest is clear to percussion and auscultation.  There are no rales or rhonchi.  Expansion of the chest is symmetrical.  The precordium is quiet.  The pulse is irregularly irregular The first heart sound is normal.  The second heart sound is physiologically split.  There is no murmur gallop rub or click.  There is no abnormal lift or heave.  The abdomen is soft and nontender.  The bowel sounds are normal.  The liver and spleen are not enlarged.  There are no abdominal masses.  There are no abdominal bruits.  Extremities reveal bilateral lymphedema and both lower legs are wrapped in Ace wraps.  There is no cyanosis or clubbing.  Strength is normal and symmetrical in all extremities.  There is no lateralizing weakness.  There are no sensory deficits.  The skin is warm and dry.  There is no rash.     Assessment / Plan: Resume warfarin at her previous dose and be rechecked in one week. Full labs pending from today.   Recheck in one month for followup office visit EKG and CBC.

## 2012-06-15 NOTE — Patient Instructions (Addendum)
Will obtain labs today and call you with the results (cbc/bmet/lp/tsh/uric acid/protime/hfp)  Your physician recommends that you schedule a follow-up appointment in: 1 month ov/cbc/ekg  Restart your Coumadin at 5 mg x 5 days and 7.5 mg x 2 days  FOLLOW UP AT THE [facility] OFFICE FOR INR CHECK IN 1 WEEK

## 2012-06-15 NOTE — Progress Notes (Signed)
Quick Note:  Please report to patient. The recent labs are stable. Continue same medication and careful diet. Mild anemia. Continue iron. ______

## 2012-06-15 NOTE — Assessment & Plan Note (Signed)
Continue ferrous sulfate 325 mg twice a day.  Her checking a baseline CBC today

## 2012-06-15 NOTE — Telephone Encounter (Signed)
Message copied by Burnell Blanks on Thu Jun 15, 2012  4:47 PM ------      Message from: Cassell Clement      Created: Thu Jun 15, 2012  3:34 PM       Please report to patient.  The recent labs are stable. Continue same medication and careful diet. Mild anemia.  Continue iron.

## 2012-06-15 NOTE — Telephone Encounter (Signed)
Advised patient of lab results  

## 2012-06-15 NOTE — Assessment & Plan Note (Addendum)
She is an established chronic atrial fibrillation.  She is presently rate controlled on propranolol 20 mg twice a day.  We're restarting her warfarin today and she will get a prothrombin time/INR checked at the [facility] office in [place] in one week.  Her stools are dark from her iron therapy.  She has not seen any hematochezia.

## 2012-06-15 NOTE — Assessment & Plan Note (Signed)
Blood pressure was remaining stable on current therapy.  She's not having any symptoms of congestive heart failure.  Her overall weight is down 22 pounds since last visit flexing the severity of her recent hospitalization and illness

## 2012-06-16 NOTE — Telephone Encounter (Signed)
Yes, okay to take Mobic.  She has been on that in the past without any difficulty when on Coumadin.

## 2012-06-23 ENCOUNTER — Ambulatory Visit (INDEPENDENT_AMBULATORY_CARE_PROVIDER_SITE_OTHER): Payer: Medicare Other | Admitting: *Deleted

## 2012-06-23 DIAGNOSIS — I4891 Unspecified atrial fibrillation: Secondary | ICD-10-CM

## 2012-06-23 DIAGNOSIS — Z7901 Long term (current) use of anticoagulants: Secondary | ICD-10-CM

## 2012-06-23 LAB — POCT INR: INR: 1.5

## 2012-06-30 ENCOUNTER — Ambulatory Visit (INDEPENDENT_AMBULATORY_CARE_PROVIDER_SITE_OTHER): Payer: Medicare Other | Admitting: *Deleted

## 2012-06-30 DIAGNOSIS — Z7901 Long term (current) use of anticoagulants: Secondary | ICD-10-CM

## 2012-06-30 DIAGNOSIS — I4891 Unspecified atrial fibrillation: Secondary | ICD-10-CM

## 2012-06-30 LAB — POCT INR: INR: 2.2

## 2012-07-21 ENCOUNTER — Ambulatory Visit (INDEPENDENT_AMBULATORY_CARE_PROVIDER_SITE_OTHER): Payer: Medicare Other | Admitting: *Deleted

## 2012-07-21 DIAGNOSIS — I4891 Unspecified atrial fibrillation: Secondary | ICD-10-CM

## 2012-07-21 DIAGNOSIS — Z7901 Long term (current) use of anticoagulants: Secondary | ICD-10-CM

## 2012-07-24 ENCOUNTER — Other Ambulatory Visit: Payer: Self-pay | Admitting: *Deleted

## 2012-07-24 DIAGNOSIS — D509 Iron deficiency anemia, unspecified: Secondary | ICD-10-CM

## 2012-07-25 ENCOUNTER — Encounter: Payer: Self-pay | Admitting: Cardiology

## 2012-07-25 ENCOUNTER — Ambulatory Visit (INDEPENDENT_AMBULATORY_CARE_PROVIDER_SITE_OTHER): Payer: Medicare Other | Admitting: Cardiology

## 2012-07-25 ENCOUNTER — Other Ambulatory Visit (INDEPENDENT_AMBULATORY_CARE_PROVIDER_SITE_OTHER): Payer: Medicare Other

## 2012-07-25 VITALS — BP 121/79 | HR 90 | Ht 72.0 in | Wt 271.0 lb

## 2012-07-25 DIAGNOSIS — D509 Iron deficiency anemia, unspecified: Secondary | ICD-10-CM

## 2012-07-25 DIAGNOSIS — L659 Nonscarring hair loss, unspecified: Secondary | ICD-10-CM

## 2012-07-25 DIAGNOSIS — I119 Hypertensive heart disease without heart failure: Secondary | ICD-10-CM

## 2012-07-25 DIAGNOSIS — E039 Hypothyroidism, unspecified: Secondary | ICD-10-CM

## 2012-07-25 DIAGNOSIS — I4891 Unspecified atrial fibrillation: Secondary | ICD-10-CM

## 2012-07-25 LAB — CBC WITH DIFFERENTIAL/PLATELET
Basophils Relative: 0.4 % (ref 0.0–3.0)
Eosinophils Absolute: 0.1 10*3/uL (ref 0.0–0.7)
Eosinophils Relative: 2.9 % (ref 0.0–5.0)
Lymphocytes Relative: 20.6 % (ref 12.0–46.0)
MCHC: 32.5 g/dL (ref 30.0–36.0)
Neutrophils Relative %: 68.3 % (ref 43.0–77.0)
RBC: 4.11 Mil/uL (ref 3.87–5.11)
WBC: 4.6 10*3/uL (ref 4.5–10.5)

## 2012-07-25 NOTE — Assessment & Plan Note (Signed)
EKG today shows atrial fibrillation with a controlled ventricular response.  Is not having any TIA symptoms

## 2012-07-25 NOTE — Assessment & Plan Note (Signed)
The patient has not been aware of any hematochezia.  We are checking a CBC today.

## 2012-07-25 NOTE — Progress Notes (Signed)
Sharon Roy Date of Birth:  1946-07-20 [facility] [address] [place], [place]  [address] [phone]         Fax   [phone]  History of Present Illness:  This pleasant 66 year old woman is seen for a scheduled 3 month followup office visit. She has chronic atrial fibrillation and is on Coumadin. She also has a history of dyslipidemia, hypertension, hypothyroidism, and gout.  She has a recent history of iron deficiency anemia following a GI bleed about 3 months ago.  Iron therapy.  He has been experiencing some problems with alopecia possibly related to medication.   Current Outpatient Prescriptions  Medication Sig Dispense Refill  . allopurinol (ZYLOPRIM) 300 MG tablet TAKE 1 TABLET EVERY DAY  60 tablet  3  . ALPRAZolam (XANAX) 0.5 MG tablet Take 1 tablet (0.5 mg total) by mouth 3 (three) times daily as needed.  90 tablet  4  . atorvastatin (LIPITOR) 10 MG tablet TAKE 1 TABLET BY MOUTH EVERY DAY  90 tablet  3  . BIOTIN PO Take by mouth.      . Calcium Carbonate-Vitamin D (OSCAL 500/200 D-3 PO) Take by mouth daily.        . Cetirizine HCl (ZYRTEC PO) Take by mouth daily.        . Cholecalciferol (VITAMIN D PO) Take 2,000 Units by mouth daily.        [name] Calcium (STOOL SOFTENER PO) Take by mouth daily.        . ferrous sulfate 325 (65 FE) MG tablet Take 325 mg by mouth 2 (two) times daily.      . furosemide (LASIX) 40 MG tablet Take 40 mg by mouth as directed. 1/2 TABLET DAILY      . levothyroxine (SYNTHROID, LEVOTHROID) 75 MCG tablet Take 75 mcg by mouth daily.        . meclizine (ANTIVERT) 25 MG tablet Take 25 mg by mouth as needed.      . metFORMIN (GLUCOPHAGE) 500 MG tablet Take 500 mg by mouth daily with breakfast.      . Multiple Vitamin (MULTIVITAMIN PO) Take by mouth daily.        Marland Kitchen nystatin-triamcinolone (MYCOLOG II) cream as needed.      . propranolol (INDERAL) 20 MG tablet Take 20 mg by mouth 2 (two) times daily.       . ramipril  (ALTACE) 5 MG capsule TAKE ONE CAPSULE BY MOUTH EVERY DAY  90 capsule  3  . warfarin (COUMADIN) 5 MG tablet 1 daily except 1 1/2 on Tuesday and Thursday or as directed  90 tablet  5    Allergies  Allergen Reactions  . Fexofenadine Hcl   . Penicillins     Patient Active Problem List  Diagnosis  . Atrial fibrillation  . 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 Hypothyroidism  . Dyslipidemia  . Gout  . Iron deficiency anemia, unspecified  . Alopecia    History  Smoking status  . Never Smoker   Smokeless tobacco  . Not on file    History  Alcohol Use: Not on file    No family history on file.  Review of Systems: Constitutional: no fever chills diaphoresis or fatigue or change in weight.  Head and neck: no hearing loss, no epistaxis, no photophobia or visual disturbance. Respiratory: No cough, shortness of breath or wheezing. Cardiovascular: No chest pain peripheral edema, palpitations. Gastrointestinal: No abdominal distention, no abdominal pain, no change  in bowel habits hematochezia or melena. Genitourinary: No dysuria, no frequency, no urgency, no nocturia. Musculoskeletal:No arthralgias, no back pain, no gait disturbance or myalgias. Neurological: No dizziness, no headaches, no numbness, no seizures, no syncope, no weakness, no tremors. Hematologic: No lymphadenopathy, no easy bruising. Psychiatric: No confusion, no hallucinations, no sleep disturbance.    Physical Exam: Filed Vitals:   07/25/12 1044  BP: 121/79  Pulse: 90   the general appearance reveals a well-developed well-nourished woman in no distress.  Her weight is down another 6 pounds since last visit.The head and neck exam reveals pupils equal and reactive.  Alopecia is noted.  Extraocular movements are full.  There is no scleral icterus.  The mouth and pharynx are normal.  The neck is supple.  The carotids reveal no bruits.  The jugular venous pressure is normal.  The  thyroid is not  enlarged.  There is no lymphadenopathy.  The chest is clear to percussion and auscultation.  There are no rales or rhonchi.  Expansion of the chest is symmetrical.  The rhythm is irregular. The precordium is quiet.  The first heart sound is normal.  The second heart sound is physiologically split.  There is no murmur gallop rub or click.  There is no abnormal lift or heave.  The abdomen is soft and nontender.  The bowel sounds are normal.  The liver and spleen are not enlarged.  There are no abdominal masses.  There are no abdominal bruits.  Extremities reveal good pedal pulses.  There is chronic lymphedema of the lower extremities and her legs are wrapped once a week at the wound center.  There is no cyanosis or clubbing.  Strength is normal and symmetrical in all extremities.  There is no lateralizing weakness.  There are no sensory deficits.  The skin is warm and dry.  There is no rash.   EKG shows atrial fibrillation with a controlled ventricular response and no acute ischemic changes  Assessment / Plan: Any same medication except stop hydrochlorothiazide altogether to see if that is contributing to the alopecia.  In its place she will take half of a Lasix 40 mg tablet daily and she will also biotin 1 tablet daily. Recheck in 3 months for followup office visit CBC and basal metabolic panel

## 2012-07-25 NOTE — Progress Notes (Signed)
Quick Note:  Please report to patient. The recent labs are stable. Continue same medication and careful diet. Hgb up to 12.5 ______

## 2012-07-25 NOTE — Patient Instructions (Addendum)
STOP HYDROCHLOROTHIAZIDE AND START BACK LASIX 40 MG 1/2 DAILY  Your physician recommends that you schedule a follow-up appointment in: 3 MONTH OV/BMET/CBC

## 2012-07-25 NOTE — Assessment & Plan Note (Signed)
Patient has noted recent alopecia since her admission to [facility].  She is concerned that some of her medication may be contributing to the problem.  We will have the patient stop her hydrochlorothiazide and observe response.  We will also add Biotin 1 tablet daily.

## 2012-07-25 NOTE — Assessment & Plan Note (Signed)
The patient is clinically euthyroid.  Her thyroid function was checked recently and was normal on current dose of Levothroid

## 2012-07-26 ENCOUNTER — Telehealth: Payer: Self-pay

## 2012-07-26 ENCOUNTER — Telehealth: Payer: Self-pay | Admitting: *Deleted

## 2012-07-26 MED ORDER — FUROSEMIDE 40 MG PO TABS: 40.0000 mg | ORAL_TABLET | ORAL | Status: DC

## 2012-07-26 NOTE — Telephone Encounter (Signed)
Advised patient of lab results  

## 2012-07-26 NOTE — Telephone Encounter (Signed)
Follow-up:    Called in needing clarification about the dose for the patients furosemide (LASIX) 40 MG tablet. Please call back.

## 2012-07-26 NOTE — Telephone Encounter (Signed)
Message copied by Burnell Blanks on Wed Jul 26, 2012 10:43 AM ------      Message from: Cassell Clement      Created: Tue Jul 25, 2012  9:38 PM       Please report to patient.  The recent labs are stable. Continue same medication and careful diet. Hgb up to 12.5

## 2012-07-31 ENCOUNTER — Other Ambulatory Visit: Payer: Self-pay | Admitting: *Deleted

## 2012-08-03 ENCOUNTER — Other Ambulatory Visit: Payer: Self-pay

## 2012-08-03 MED ORDER — FUROSEMIDE 40 MG PO TABS: 40.0000 mg | ORAL_TABLET | ORAL | Status: DC

## 2012-08-15 ENCOUNTER — Telehealth: Payer: Self-pay | Admitting: *Deleted

## 2012-08-15 NOTE — Telephone Encounter (Signed)
Has appt at [facility] at 8:30am same day.  Afraid she might be late for INR appt.  Told pt to come on after other appt.  It would be fine if she is a little late.

## 2012-08-15 NOTE — Telephone Encounter (Signed)
Would like to discuss her upcoming appt with you

## 2012-08-18 ENCOUNTER — Ambulatory Visit (INDEPENDENT_AMBULATORY_CARE_PROVIDER_SITE_OTHER): Payer: Medicare Other | Admitting: *Deleted

## 2012-08-18 DIAGNOSIS — I4891 Unspecified atrial fibrillation: Secondary | ICD-10-CM

## 2012-08-18 DIAGNOSIS — Z7901 Long term (current) use of anticoagulants: Secondary | ICD-10-CM

## 2012-08-18 LAB — POCT INR: INR: 1.8

## 2012-09-08 ENCOUNTER — Ambulatory Visit (INDEPENDENT_AMBULATORY_CARE_PROVIDER_SITE_OTHER): Payer: Medicare Other | Admitting: *Deleted

## 2012-09-08 DIAGNOSIS — I4891 Unspecified atrial fibrillation: Secondary | ICD-10-CM

## 2012-09-08 DIAGNOSIS — Z7901 Long term (current) use of anticoagulants: Secondary | ICD-10-CM

## 2012-09-08 LAB — POCT INR: INR: 2.2

## 2012-09-28 ENCOUNTER — Other Ambulatory Visit: Payer: Self-pay | Admitting: *Deleted

## 2012-09-28 DIAGNOSIS — F419 Anxiety disorder, unspecified: Secondary | ICD-10-CM

## 2012-09-28 MED ORDER — ALPRAZOLAM 0.5 MG PO TABS: 0.5000 mg | ORAL_TABLET | Freq: Three times a day (TID) | ORAL | Status: DC | PRN

## 2012-09-29 ENCOUNTER — Ambulatory Visit (INDEPENDENT_AMBULATORY_CARE_PROVIDER_SITE_OTHER): Payer: Medicare Other | Admitting: *Deleted

## 2012-09-29 LAB — POCT INR: INR: 3.1

## 2012-10-20 ENCOUNTER — Ambulatory Visit (INDEPENDENT_AMBULATORY_CARE_PROVIDER_SITE_OTHER): Payer: Medicare Other | Admitting: *Deleted

## 2012-10-20 DIAGNOSIS — I4891 Unspecified atrial fibrillation: Secondary | ICD-10-CM

## 2012-10-23 ENCOUNTER — Encounter: Payer: Self-pay | Admitting: Cardiology

## 2012-10-23 ENCOUNTER — Other Ambulatory Visit (INDEPENDENT_AMBULATORY_CARE_PROVIDER_SITE_OTHER): Payer: Medicare Other

## 2012-10-23 ENCOUNTER — Ambulatory Visit (INDEPENDENT_AMBULATORY_CARE_PROVIDER_SITE_OTHER): Payer: Medicare Other | Admitting: Cardiology

## 2012-10-23 VITALS — BP 138/82 | HR 52 | Ht 73.0 in | Wt 285.4 lb

## 2012-10-23 DIAGNOSIS — L659 Nonscarring hair loss, unspecified: Secondary | ICD-10-CM

## 2012-10-23 DIAGNOSIS — D509 Iron deficiency anemia, unspecified: Secondary | ICD-10-CM

## 2012-10-23 DIAGNOSIS — I4891 Unspecified atrial fibrillation: Secondary | ICD-10-CM

## 2012-10-23 DIAGNOSIS — I119 Hypertensive heart disease without heart failure: Secondary | ICD-10-CM

## 2012-10-23 LAB — CBC WITH DIFFERENTIAL/PLATELET
Eosinophils Relative: 3.9 % (ref 0.0–5.0)
HCT: 40 % (ref 36.0–46.0)
Lymphocytes Relative: 23.9 % (ref 12.0–46.0)
Lymphs Abs: 1.1 10*3/uL (ref 0.7–4.0)
Monocytes Relative: 7.1 % (ref 3.0–12.0)
Neutrophils Relative %: 64.8 % (ref 43.0–77.0)
Platelets: 140 10*3/uL — ABNORMAL LOW (ref 150.0–400.0)
WBC: 4.4 10*3/uL — ABNORMAL LOW (ref 4.5–10.5)

## 2012-10-23 LAB — BASIC METABOLIC PANEL
BUN: 13 mg/dL (ref 6–23)
Calcium: 9.2 mg/dL (ref 8.4–10.5)
GFR: 84.85 mL/min (ref >60.00)
Potassium: 4.3 mEq/L (ref 3.5–5.1)

## 2012-10-23 NOTE — Assessment & Plan Note (Signed)
Blood pressure has been remaining stable on current therapy.  No headaches or dizzy spells.

## 2012-10-23 NOTE — Progress Notes (Signed)
Quick Note:  Please report to patient. The recent labs are stable. Continue same medication and careful diet. Hgb is better so decrease iron tablets to just one daily. ______

## 2012-10-23 NOTE — Progress Notes (Signed)
Sharon Roy Date of Birth:  [birth year] [facility] [address] [place], [place]  [address] [phone]         Fax   [phone]  History of Present Illness: This pleasant 67 year old woman is seen for a scheduled 3 month followup office visit. She has chronic atrial fibrillation and is on Coumadin. She also has a history of dyslipidemia, hypertension, hypothyroidism, and gout. She has a recent history of iron deficiency anemia following a GI bleed about 6months ago. Iron therapy. He has been experiencing some problems with alopecia possibly related to medication.  The alopecia has improved since she started taking Biotin.   Current Outpatient Prescriptions  Medication Sig Dispense Refill  . allopurinol (ZYLOPRIM) 300 MG tablet TAKE 1 TABLET EVERY DAY  60 tablet  3  . ALPRAZolam (XANAX) 0.5 MG tablet Take 1 tablet (0.5 mg total) by mouth 3 (three) times daily as needed.  90 tablet  4  . Ascorbic Acid (VITAMIN C) 1000 MG tablet Take 1,000 mg by mouth daily.      Marland Kitchen atorvastatin (LIPITOR) 10 MG tablet TAKE 1 TABLET BY MOUTH EVERY DAY  90 tablet  3  . BIOTIN PO Take by mouth.      . Calcium Carbonate-Vitamin D (OSCAL 500/200 D-3 PO) Take by mouth daily.        . Cetirizine HCl (ZYRTEC PO) Take by mouth daily.        . Cholecalciferol (VITAMIN D PO) Take 2,000 Units by mouth daily.        [name] Calcium (STOOL SOFTENER PO) Take by mouth daily.        . ferrous sulfate 325 (65 FE) MG tablet Take 325 mg by mouth 2 (two) times daily.      . furosemide (LASIX) 40 MG tablet Take 20 mg by mouth as directed.      Marland Kitchen levothyroxine (SYNTHROID, LEVOTHROID) 75 MCG tablet Take 75 mcg by mouth daily.        . meclizine (ANTIVERT) 25 MG tablet Take 25 mg by mouth as needed.      . metFORMIN (GLUCOPHAGE) 500 MG tablet Take 500 mg by mouth daily with breakfast.      . Multiple Vitamin (MULTIVITAMIN PO) Take by mouth daily.        Marland Kitchen nystatin-triamcinolone (MYCOLOG II) cream as  needed.      . propranolol (INDERAL) 20 MG tablet Take 20 mg by mouth 2 (two) times daily.       . ramipril (ALTACE) 5 MG capsule TAKE ONE CAPSULE BY MOUTH EVERY DAY  90 capsule  3  . warfarin (COUMADIN) 5 MG tablet 5 mg. Take 1 1/2 tablets daily except 1 tablet on S,T,Th      . Zinc 50 MG TABS Take 50 mg by mouth.       No current facility-administered medications for this visit.    Allergies  Allergen Reactions  . Fexofenadine Hcl   . Penicillins     Patient Active Problem List  Diagnosis  . Atrial fibrillation  . 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 Hypothyroidism  . Dyslipidemia  . Gout  . Iron deficiency anemia, unspecified  . Alopecia    History  Smoking status  . Never Smoker   Smokeless tobacco  . Not on file    History  Alcohol Use: Not on file    No family history on file.  Review of Systems: Constitutional: no fever chills diaphoresis  or fatigue or change in weight.  Head and neck: no hearing loss, no epistaxis, no photophobia or visual disturbance. Respiratory: No cough, shortness of breath or wheezing. Cardiovascular: No chest pain peripheral edema, palpitations. Gastrointestinal: No abdominal distention, no abdominal pain, no change in bowel habits hematochezia or melena. Genitourinary: No dysuria, no frequency, no urgency, no nocturia. Musculoskeletal:No arthralgias, no back pain, no gait disturbance or myalgias. Neurological: No dizziness, no headaches, no numbness, no seizures, no syncope, no weakness, no tremors. Hematologic: No lymphadenopathy, no easy bruising. Psychiatric: No confusion, no hallucinations, no sleep disturbance.    Physical Exam: Filed Vitals:   10/23/12 1019  BP: 138/82  Pulse: 52   the general appearance reveals a large woman in no distress.The head and neck exam reveals pupils equal and reactive.  Extraocular movements are full.  There is no scleral icterus.  The mouth and pharynx are normal.  The  neck is supple.  The carotids reveal no bruits.  The jugular venous pressure is normal.  The  thyroid is not enlarged.  There is no lymphadenopathy.  The chest is clear to percussion and auscultation.  There are no rales or rhonchi.  Expansion of the chest is symmetrical.  The precordium is quiet.  The first heart sound is normal.  The second heart sound is physiologically split.  There is no murmur gallop rub or click.  There is no abnormal lift or heave.  The abdomen is soft and nontender.  The bowel sounds are normal.  The liver and spleen are not enlarged.  There are no abdominal masses.  There are no abdominal bruits.  Extremities reveal that both legs are wrapped with Ace wraps up to the knee she is being followed in the wound clinic in [place].    Strength is normal and symmetrical in all extremities.  There is no lateralizing weakness.  There are no sensory deficits.       Assessment / Plan: Continue same medication.  Her weight is up 14 pounds since last visit and she will try harder to lose weight.  She had been taking protein shakes as well as eating a regular meal. Recheck in 3 months for followup office visit CBC TSH lipid panel hepatic function panel and basal metabolic panel

## 2012-10-23 NOTE — Assessment & Plan Note (Signed)
The patient has not had any flareup of gout since last visit.  She remains on allopurinol 300 mg daily

## 2012-10-23 NOTE — Patient Instructions (Addendum)
Your physician recommends that you continue on your current medications as directed. Please refer to the Current Medication list given to you today.  .Your physician recommends that you schedule a follow-up appointment in: 3 months with fasting labs (LP/BMET/HFP/TSH/CBC)

## 2012-10-23 NOTE — Assessment & Plan Note (Signed)
No TIA symptoms.  No symptoms of CHF.

## 2012-10-24 ENCOUNTER — Telehealth: Payer: Self-pay | Admitting: *Deleted

## 2012-10-24 NOTE — Telephone Encounter (Signed)
Advised patient of lab results and medication change  

## 2012-10-24 NOTE — Telephone Encounter (Signed)
Message copied by Burnell Blanks on Tue Oct 24, 2012  9:13 AM ------      Message from: Cassell Clement      Created: Mon Oct 23, 2012  7:06 PM       Please report to patient.  The recent labs are stable. Continue same medication and careful diet. Hgb is better so decrease iron tablets to just one daily. ------

## 2012-11-17 ENCOUNTER — Ambulatory Visit (INDEPENDENT_AMBULATORY_CARE_PROVIDER_SITE_OTHER): Payer: Medicare Other | Admitting: *Deleted

## 2012-11-17 DIAGNOSIS — I4891 Unspecified atrial fibrillation: Secondary | ICD-10-CM

## 2012-11-17 DIAGNOSIS — Z7901 Long term (current) use of anticoagulants: Secondary | ICD-10-CM

## 2012-11-17 LAB — POCT INR: INR: 3.2

## 2012-12-01 ENCOUNTER — Other Ambulatory Visit: Payer: Self-pay

## 2012-12-01 DIAGNOSIS — Z1231 Encounter for screening mammogram for malignant neoplasm of breast: Secondary | ICD-10-CM

## 2012-12-08 ENCOUNTER — Ambulatory Visit (INDEPENDENT_AMBULATORY_CARE_PROVIDER_SITE_OTHER): Payer: Medicare Other | Admitting: *Deleted

## 2012-12-08 DIAGNOSIS — Z7901 Long term (current) use of anticoagulants: Secondary | ICD-10-CM

## 2012-12-08 DIAGNOSIS — I4891 Unspecified atrial fibrillation: Secondary | ICD-10-CM

## 2012-12-22 ENCOUNTER — Ambulatory Visit (INDEPENDENT_AMBULATORY_CARE_PROVIDER_SITE_OTHER): Payer: Medicare Other | Admitting: *Deleted

## 2012-12-22 DIAGNOSIS — Z7901 Long term (current) use of anticoagulants: Secondary | ICD-10-CM

## 2012-12-22 DIAGNOSIS — I4891 Unspecified atrial fibrillation: Secondary | ICD-10-CM

## 2013-01-03 ENCOUNTER — Ambulatory Visit: Payer: Medicare Other

## 2013-01-08 ENCOUNTER — Other Ambulatory Visit: Payer: Self-pay | Admitting: Emergency Medicine

## 2013-01-08 MED ORDER — ALLOPURINOL 300 MG PO TABS: ORAL_TABLET | ORAL | Status: AC

## 2013-01-19 ENCOUNTER — Ambulatory Visit (INDEPENDENT_AMBULATORY_CARE_PROVIDER_SITE_OTHER): Payer: Medicare Other | Admitting: *Deleted

## 2013-01-19 DIAGNOSIS — I4891 Unspecified atrial fibrillation: Secondary | ICD-10-CM

## 2013-01-19 DIAGNOSIS — Z7901 Long term (current) use of anticoagulants: Secondary | ICD-10-CM

## 2013-01-19 LAB — POCT INR: INR: 2.7

## 2013-01-22 ENCOUNTER — Encounter: Payer: Self-pay | Admitting: Cardiology

## 2013-01-22 ENCOUNTER — Other Ambulatory Visit (INDEPENDENT_AMBULATORY_CARE_PROVIDER_SITE_OTHER): Payer: Medicare Other

## 2013-01-22 ENCOUNTER — Ambulatory Visit (INDEPENDENT_AMBULATORY_CARE_PROVIDER_SITE_OTHER): Payer: Medicare Other | Admitting: Cardiology

## 2013-01-22 VITALS — BP 126/76 | HR 84 | Ht 73.0 in | Wt 287.8 lb

## 2013-01-22 DIAGNOSIS — I119 Hypertensive heart disease without heart failure: Secondary | ICD-10-CM

## 2013-01-22 DIAGNOSIS — D509 Iron deficiency anemia, unspecified: Secondary | ICD-10-CM

## 2013-01-22 DIAGNOSIS — I4891 Unspecified atrial fibrillation: Secondary | ICD-10-CM

## 2013-01-22 DIAGNOSIS — L659 Nonscarring hair loss, unspecified: Secondary | ICD-10-CM

## 2013-01-22 LAB — CBC WITH DIFFERENTIAL/PLATELET
Basophils Relative: 0.3 % (ref 0.0–3.0)
Eosinophils Relative: 3.6 % (ref 0.0–5.0)
Lymphocytes Relative: 23.6 % (ref 12.0–46.0)
MCV: 100.1 fl — ABNORMAL HIGH (ref 78.0–100.0)
Monocytes Relative: 9.4 % (ref 3.0–12.0)
Neutrophils Relative %: 63.1 % (ref 43.0–77.0)
RBC: 4.16 Mil/uL (ref 3.87–5.11)
WBC: 4.8 10*3/uL (ref 4.5–10.5)

## 2013-01-22 LAB — LIPID PANEL
HDL: 34.3 mg/dL — ABNORMAL LOW (ref >39.00)
Total CHOL/HDL Ratio: 3
Triglycerides: 69 mg/dL (ref 0.0–149.0)

## 2013-01-22 LAB — TSH: TSH: 0.44 u[IU]/mL (ref 0.35–5.50)

## 2013-01-22 LAB — BASIC METABOLIC PANEL
CO2: 32 mEq/L (ref 19–32)
Calcium: 9.7 mg/dL (ref 8.4–10.5)
Creatinine, Ser: 0.7 mg/dL (ref 0.4–1.2)

## 2013-01-22 LAB — HEPATIC FUNCTION PANEL
Albumin: 3.8 g/dL (ref 3.5–5.2)
Bilirubin, Direct: 0.2 mg/dL (ref 0.0–0.3)
Total Protein: 6.9 g/dL (ref 6.0–8.3)

## 2013-01-22 NOTE — Assessment & Plan Note (Signed)
She has not had any symptoms of hematochezia or melena.  She is on a new type of iron tablet she purchased over-the-counter

## 2013-01-22 NOTE — Assessment & Plan Note (Signed)
Blood pressure was remaining stable on current therapy.  Her alopecia has improved since stopping HCTZ

## 2013-01-22 NOTE — Progress Notes (Signed)
Sharon Roy Date of Birth:  [birth year] [facility] [address] [place], [place]  [address] [phone]         Fax   [phone]  History of Present Illness: This pleasant 66 year old woman is seen for a scheduled 3 month followup office visit. She has chronic atrial fibrillation and is on Coumadin. She also has a history of dyslipidemia, hypertension, hypothyroidism, and gout. She has a recent history of iron deficiency anemia following a GI bleed about 6months ago. Iron therapy. He has been experiencing some problems with alopecia possibly related to medication. The alopecia has improved since she started taking Biotin.  She feels that the alopecia  was secondary to HCTZ.   Current Outpatient Prescriptions  Medication Sig Dispense Refill  . allopurinol (ZYLOPRIM) 300 MG tablet TAKE 1 TABLET EVERY DAY  60 tablet  3  . ALPRAZolam (XANAX) 0.5 MG tablet Take 1 tablet (0.5 mg total) by mouth 3 (three) times daily as needed.  90 tablet  4  . Ascorbic Acid (VITAMIN C) 1000 MG tablet Take 1,000 mg by mouth daily.      Marland Kitchen atorvastatin (LIPITOR) 10 MG tablet TAKE 1 TABLET BY MOUTH EVERY DAY  90 tablet  3  . BIOTIN PO Take by mouth.      . Calcium Carbonate-Vitamin D (OSCAL 500/200 D-3 PO) Take by mouth daily.        . Cetirizine HCl (ZYRTEC PO) Take by mouth daily.        . Cholecalciferol (VITAMIN D PO) Take 2,000 Units by mouth daily.        [name] Calcium (STOOL SOFTENER PO) Take by mouth daily.        . ferrous sulfate 325 (65 FE) MG tablet Take 325 mg by mouth daily.       . furosemide (LASIX) 40 MG tablet Take 20 mg by mouth as directed.      Marland Kitchen levothyroxine (SYNTHROID, LEVOTHROID) 75 MCG tablet Take 75 mcg by mouth daily.        . meclizine (ANTIVERT) 25 MG tablet Take 25 mg by mouth as needed.      . metFORMIN (GLUCOPHAGE) 500 MG tablet Take 500 mg by mouth daily with breakfast.      . Multiple Vitamin (MULTIVITAMIN PO) Take by mouth daily.        Marland Kitchen  nystatin-triamcinolone (MYCOLOG II) cream as needed.      . Omega-3 Fatty Acids (FISH OIL) 1000 MG CAPS Take 1,000 mg by mouth 2 (two) times daily.      . propranolol (INDERAL) 20 MG tablet Take 20 mg by mouth 2 (two) times daily.       . ramipril (ALTACE) 5 MG capsule TAKE ONE CAPSULE BY MOUTH EVERY DAY  90 capsule  3  . warfarin (COUMADIN) 5 MG tablet 5 mg. Take 1 1/2 tablets daily except 1 tablet on S,T,Th      . Zinc 50 MG TABS Take 50 mg by mouth.       No current facility-administered medications for this visit.    Allergies  Allergen Reactions  . Fexofenadine Hcl   . Penicillins     Patient Active Problem List   Diagnosis Date Noted  . Atrial fibrillation 11/27/2010    Priority: High  . Type II or unspecified type diabetes mellitus without mention of complication, uncontrolled 01/22/2013  . Alopecia 07/25/2012  . Iron deficiency anemia, unspecified 06/15/2012  . Benign hypertensive heart disease without heart  failure 12/22/2010  . Hypothyroidism 12/22/2010  . Dyslipidemia 12/22/2010  . Gout 12/22/2010    History  Smoking status  . Never Smoker   Smokeless tobacco  . Not on file    History  Alcohol Use: Not on file    No family history on file.  Review of Systems: Constitutional: no fever chills diaphoresis or fatigue or change in weight.  Head and neck: no hearing loss, no epistaxis, no photophobia or visual disturbance. Respiratory: No cough, shortness of breath or wheezing. Cardiovascular: No chest pain peripheral edema, palpitations. Gastrointestinal: No abdominal distention, no abdominal pain, no change in bowel habits hematochezia or melena. Genitourinary: No dysuria, no frequency, no urgency, no nocturia. Musculoskeletal:No arthralgias, no back pain, no gait disturbance or myalgias. Neurological: No dizziness, no headaches, no numbness, no seizures, no syncope, no weakness, no tremors. Hematologic: No lymphadenopathy, no easy bruising. Psychiatric: No  confusion, no hallucinations, no sleep disturbance.    Physical Exam: Filed Vitals:   01/22/13 1017  BP: 126/76  Pulse: 84   the general appearance reveals a large woman in no acute distress.  Her weight is up 2 pounds since last visit.The head and neck exam reveals pupils equal and reactive.  Extraocular movements are full.  There is no scleral icterus.  The mouth and pharynx are normal.  The neck is supple.  The carotids reveal no bruits.  The jugular venous pressure is normal.  The  thyroid is not enlarged.  There is no lymphadenopathy.  The chest is clear to percussion and auscultation.  There are no rales or rhonchi.  Expansion of the chest is symmetrical.  The precordium is quiet.  The first heart sound is normal.  The second heart sound is physiologically split.  There is no murmur gallop rub or click.  There is no abnormal lift or heave.  The abdomen is soft and nontender.  The bowel sounds are normal.  The liver and spleen are not enlarged.  There are no abdominal masses.  There are no abdominal bruits.  Extremities reveal good pedal pulses.  There is bilateral lower extremity swelling secondary to lymphedema. There is no cyanosis or clubbing.  Strength is normal and symmetrical in all extremities.  There is no lateralizing weakness.  There are no sensory deficits.  The skin is warm and dry.  There is no rash.     Assessment / Plan: She will continue same medication.  Recheck in 3 months for followup office visit and basal metabolic panel CBC and EKG.  Needs to continue to work harder on weight loss.  The home health nurses normally dress her legs 3 times a week for  her  lymphedema

## 2013-01-22 NOTE — Patient Instructions (Addendum)
Your physician recommends that you continue on your current medications as directed. Please refer to the Current Medication list given to you today.  Your physician recommends that you schedule a follow-up appointment in: 3 months with ekg/ bmet/cbc

## 2013-01-22 NOTE — Progress Notes (Signed)
Quick Note:  Please report to patient. The recent labs are stable. Continue same medication and careful diet. No anemia. ______ 

## 2013-01-22 NOTE — Assessment & Plan Note (Signed)
The patient is in chronic permanent atrial fibrillation.  She is on long-term warfarin.  She has not had any TIA symptoms.

## 2013-02-02 ENCOUNTER — Ambulatory Visit
Admission: RE | Admit: 2013-02-02 | Discharge: 2013-02-02 | Disposition: A | Discharge status: Home / Self Care | Payer: Medicare Other | Source: Ambulatory Visit

## 2013-02-02 DIAGNOSIS — Z1231 Encounter for screening mammogram for malignant neoplasm of breast: Secondary | ICD-10-CM

## 2013-02-16 ENCOUNTER — Ambulatory Visit (INDEPENDENT_AMBULATORY_CARE_PROVIDER_SITE_OTHER): Payer: Medicare Other | Admitting: *Deleted

## 2013-02-16 DIAGNOSIS — Z7901 Long term (current) use of anticoagulants: Secondary | ICD-10-CM

## 2013-02-16 DIAGNOSIS — I4891 Unspecified atrial fibrillation: Secondary | ICD-10-CM

## 2013-03-06 ENCOUNTER — Ambulatory Visit (INDEPENDENT_AMBULATORY_CARE_PROVIDER_SITE_OTHER): Payer: Medicare Other | Admitting: *Deleted

## 2013-03-06 DIAGNOSIS — I4891 Unspecified atrial fibrillation: Secondary | ICD-10-CM

## 2013-03-06 DIAGNOSIS — Z7901 Long term (current) use of anticoagulants: Secondary | ICD-10-CM

## 2013-04-03 ENCOUNTER — Ambulatory Visit (INDEPENDENT_AMBULATORY_CARE_PROVIDER_SITE_OTHER): Payer: Medicare Other | Admitting: *Deleted

## 2013-04-03 DIAGNOSIS — I4891 Unspecified atrial fibrillation: Secondary | ICD-10-CM

## 2013-04-03 DIAGNOSIS — Z7901 Long term (current) use of anticoagulants: Secondary | ICD-10-CM

## 2013-04-26 ENCOUNTER — Other Ambulatory Visit: Payer: Medicare Other

## 2013-04-26 ENCOUNTER — Ambulatory Visit (INDEPENDENT_AMBULATORY_CARE_PROVIDER_SITE_OTHER): Payer: Medicare Other | Admitting: Cardiology

## 2013-04-26 ENCOUNTER — Encounter: Payer: Self-pay | Admitting: Cardiology

## 2013-04-26 VITALS — BP 130/78 | HR 88 | Ht 73.0 in | Wt 283.0 lb

## 2013-04-26 DIAGNOSIS — I119 Hypertensive heart disease without heart failure: Secondary | ICD-10-CM

## 2013-04-26 DIAGNOSIS — D509 Iron deficiency anemia, unspecified: Secondary | ICD-10-CM

## 2013-04-26 DIAGNOSIS — I4891 Unspecified atrial fibrillation: Secondary | ICD-10-CM

## 2013-04-26 LAB — CBC
HCT: 42.9 % (ref 36.0–46.0)
Hemoglobin: 14.3 g/dL (ref 12.0–15.0)
MCHC: 33.3 g/dL (ref 30.0–36.0)
RDW: 13.8 % (ref 11.5–14.6)
WBC: 4.6 10*3/uL (ref 4.5–10.5)

## 2013-04-26 LAB — BASIC METABOLIC PANEL
BUN: 14 mg/dL (ref 6–23)
Creatinine, Ser: 0.7 mg/dL (ref 0.4–1.2)
GFR: 83.4 mL/min (ref >60.00)
Glucose, Bld: 102 mg/dL — ABNORMAL HIGH (ref 70–99)

## 2013-04-26 NOTE — Patient Instructions (Addendum)
Will obtain labs today and call you with the results (bmet/cbc)  Your physician recommends that you continue on your current medications as directed. Please refer to the Current Medication list given to you today.  Your physician recommends that you schedule a follow-up appointment in: 3 months with fasting labs (LP/BMET/HFP/CBC/A1C)

## 2013-04-26 NOTE — Assessment & Plan Note (Signed)
The patient has chronic atrial fibrillation.  She is on long-term Coumadin.  She has not had any TIA symptoms.

## 2013-04-26 NOTE — Progress Notes (Signed)
Quick Note:  Please report to patient. The recent labs are stable. Continue same medication and careful diet. ______ 

## 2013-04-26 NOTE — Assessment & Plan Note (Signed)
Her blood pressure was remaining stable on current therapy.  Her weight is down 4 pounds since last visit.

## 2013-04-26 NOTE — Assessment & Plan Note (Signed)
The patient has not been experiencing any hypoglycemic episodes. 

## 2013-04-26 NOTE — Progress Notes (Signed)
Sharon Roy Date of Birth:  1945-11-20 [facility] [address] [place], [place]  [address] [phone]         Fax   [phone]  History of Present Illness: This pleasant 67 year old woman is seen for a scheduled 3 month followup office visit. She has chronic atrial fibrillation and is on Coumadin. She also has a history of dyslipidemia, hypertension, hypothyroidism, and gout. She has a recent history of iron deficiency anemia following a GI bleed about 6months ago. Iron therapy. He has been experiencing some problems with alopecia possibly related to medication. The alopecia has improved since she started taking Biotin.  She feels that the alopecia  was secondary to HCTZ.  She has a history of lymphedema of her lower extremities.  She has been going to the wound Center several times a week to have her legs wrapped.   Current Outpatient Prescriptions  Medication Sig Dispense Refill  . allopurinol (ZYLOPRIM) 300 MG tablet TAKE 1 TABLET EVERY DAY  60 tablet  3  . ALPRAZolam (XANAX) 0.5 MG tablet Take 1 tablet (0.5 mg total) by mouth 3 (three) times daily as needed.  90 tablet  4  . Ascorbic Acid (VITAMIN C) 1000 MG tablet Take 1,000 mg by mouth daily.      Marland Kitchen atorvastatin (LIPITOR) 10 MG tablet TAKE 1 TABLET BY MOUTH EVERY DAY  90 tablet  3  . BIOTIN PO Take by mouth.      . Calcium Carbonate-Vitamin D (OSCAL 500/200 D-3 PO) Take by mouth daily.        . Cetirizine HCl (ZYRTEC PO) Take by mouth daily.        . Cholecalciferol (VITAMIN D PO) Take 2,000 Units by mouth daily.        [name] Calcium (STOOL SOFTENER PO) Take by mouth daily.        . ferrous sulfate 325 (65 FE) MG tablet Take 325 mg by mouth daily.       . furosemide (LASIX) 40 MG tablet Take 20 mg by mouth as directed.      Marland Kitchen levothyroxine (SYNTHROID, LEVOTHROID) 75 MCG tablet Take 75 mcg by mouth daily.        . meclizine (ANTIVERT) 25 MG tablet Take 25 mg by mouth as needed.      . metFORMIN  (GLUCOPHAGE) 500 MG tablet Take 500 mg by mouth daily with breakfast.      . Multiple Vitamin (MULTIVITAMIN PO) Take by mouth daily.        Marland Kitchen nystatin-triamcinolone (MYCOLOG II) cream as needed.      . Omega-3 Fatty Acids (FISH OIL) 1000 MG CAPS Take 1,000 mg by mouth 2 (two) times daily.      . propranolol (INDERAL) 20 MG tablet Take 20 mg by mouth 2 (two) times daily.       . ramipril (ALTACE) 5 MG capsule TAKE ONE CAPSULE BY MOUTH EVERY DAY  90 capsule  3  . warfarin (COUMADIN) 5 MG tablet 5 mg. Take 1 1/2 tablets daily except 1 tablet on S,T,Th      . Zinc 50 MG TABS Take 50 mg by mouth.       No current facility-administered medications for this visit.    Allergies  Allergen Reactions  . Fexofenadine Hcl   . Penicillins     Patient Active Problem List   Diagnosis Date Noted  . Atrial fibrillation 11/27/2010    Priority: High  . Type II  or unspecified type diabetes mellitus without mention of complication, uncontrolled 01/22/2013  . Alopecia 07/25/2012  . Iron deficiency anemia, unspecified 06/15/2012  . Benign hypertensive heart disease without heart failure 12/22/2010  . Hypothyroidism 12/22/2010  . Dyslipidemia 12/22/2010  . Gout 12/22/2010    History  Smoking status  . Never Smoker   Smokeless tobacco  . Not on file    History  Alcohol Use: Not on file    No family history on file.  Review of Systems: Constitutional: no fever chills diaphoresis or fatigue or change in weight.  Head and neck: no hearing loss, no epistaxis, no photophobia or visual disturbance. Respiratory: No cough, shortness of breath or wheezing. Cardiovascular: No chest pain peripheral edema, palpitations. Gastrointestinal: No abdominal distention, no abdominal pain, no change in bowel habits hematochezia or melena. Genitourinary: No dysuria, no frequency, no urgency, no nocturia. Musculoskeletal:No arthralgias, no back pain, no gait disturbance or myalgias. Neurological: No dizziness, no  headaches, no numbness, no seizures, no syncope, no weakness, no tremors. Hematologic: No lymphadenopathy, no easy bruising. Psychiatric: No confusion, no hallucinations, no sleep disturbance.    Physical Exam: Filed Vitals:   04/26/13 0832  BP: 130/78  Pulse: 88   the general appearance reveals a large woman in no acute distress.  Her weight is up 2 pounds since last visit.The head and neck exam reveals pupils equal and reactive.  Extraocular movements are full.  There is no scleral icterus.  The mouth and pharynx are normal.  The neck is supple.  The carotids reveal no bruits.  The jugular venous pressure is normal.  The  thyroid is not enlarged.  There is no lymphadenopathy.  The chest is clear to percussion and auscultation.  There are no rales or rhonchi.  Expansion of the chest is symmetrical.  The precordium is quiet.  The first heart sound is normal.  The second heart sound is physiologically split.  There is no murmur gallop rub or click.  There is no abnormal lift or heave.  The abdomen is soft and nontender.  The bowel sounds are normal.  The liver and spleen are not enlarged.  There are no abdominal masses.  There are no abdominal bruits.  Extremities reveal good pedal pulses.  There is bilateral lower extremity swelling secondary to lymphedema.  Both of her legs are wrapped tightly up to the knees today.  Patient states that the previous weeping ulcers have cleared up. There is no cyanosis or clubbing.  Strength is normal and symmetrical in all extremities.  There is no lateralizing weakness.  There are no sensory deficits.  The skin is warm and dry.  There is no rash.   EKG shows atrial fibrillation and left axis deviation and incomplete right bundle branch block and is unchanged from 07/25/12   Assessment / Plan: She will continue same medication.  Recheck in 3 months for followup office visit and lipid panel hepatic function panel basal metabolic panel CBC and A1c.  Needs to  continue to work harder on weight loss.  I commended her on her 4 pound weight loss.  The home health nurses normally dress her legs 3 times a week for  her  lymphedema

## 2013-04-27 ENCOUNTER — Telehealth: Payer: Self-pay | Admitting: *Deleted

## 2013-04-27 NOTE — Telephone Encounter (Signed)
Advised patient of lab results  

## 2013-04-27 NOTE — Telephone Encounter (Signed)
Message copied by Burnell Blanks on Fri Apr 27, 2013  8:31 AM ------      Message from: Cassell Clement      Created: Thu Apr 26, 2013  9:33 PM       Please report to patient.  The recent labs are stable. Continue same medication and careful diet. ------

## 2013-05-01 ENCOUNTER — Ambulatory Visit (INDEPENDENT_AMBULATORY_CARE_PROVIDER_SITE_OTHER): Payer: Medicare Other | Admitting: *Deleted

## 2013-05-01 DIAGNOSIS — I4891 Unspecified atrial fibrillation: Secondary | ICD-10-CM

## 2013-05-01 DIAGNOSIS — Z7901 Long term (current) use of anticoagulants: Secondary | ICD-10-CM

## 2013-05-01 LAB — POCT INR: INR: 2.8

## 2013-05-03 ENCOUNTER — Encounter: Payer: Self-pay | Admitting: Cardiology

## 2013-05-03 ENCOUNTER — Other Ambulatory Visit: Payer: Self-pay | Admitting: Cardiology

## 2013-05-03 DIAGNOSIS — F419 Anxiety disorder, unspecified: Secondary | ICD-10-CM

## 2013-05-29 ENCOUNTER — Ambulatory Visit (INDEPENDENT_AMBULATORY_CARE_PROVIDER_SITE_OTHER): Payer: Medicare Other | Admitting: *Deleted

## 2013-05-29 DIAGNOSIS — I4891 Unspecified atrial fibrillation: Secondary | ICD-10-CM

## 2013-05-29 DIAGNOSIS — Z7901 Long term (current) use of anticoagulants: Secondary | ICD-10-CM

## 2013-05-29 LAB — POCT INR: INR: 2.5

## 2013-06-04 ENCOUNTER — Other Ambulatory Visit: Payer: Self-pay | Admitting: Cardiology

## 2013-06-14 ENCOUNTER — Other Ambulatory Visit: Payer: Self-pay | Admitting: Cardiology

## 2013-06-19 ENCOUNTER — Other Ambulatory Visit: Payer: Self-pay | Admitting: *Deleted

## 2013-06-19 MED ORDER — WARFARIN SODIUM 5 MG PO TABS: ORAL_TABLET | ORAL | Status: DC

## 2013-07-10 ENCOUNTER — Ambulatory Visit (INDEPENDENT_AMBULATORY_CARE_PROVIDER_SITE_OTHER): Payer: Medicare Other | Admitting: *Deleted

## 2013-07-10 DIAGNOSIS — I4891 Unspecified atrial fibrillation: Secondary | ICD-10-CM

## 2013-07-10 DIAGNOSIS — Z7901 Long term (current) use of anticoagulants: Secondary | ICD-10-CM

## 2013-07-10 LAB — POCT INR: INR: 2

## 2013-07-24 ENCOUNTER — Encounter: Payer: Self-pay | Admitting: Cardiology

## 2013-07-24 ENCOUNTER — Ambulatory Visit (INDEPENDENT_AMBULATORY_CARE_PROVIDER_SITE_OTHER): Payer: Medicare Other | Admitting: Cardiology

## 2013-07-24 ENCOUNTER — Other Ambulatory Visit: Payer: Medicare Other

## 2013-07-24 VITALS — BP 137/67 | HR 80 | Ht 73.0 in | Wt 288.0 lb

## 2013-07-24 DIAGNOSIS — E785 Hyperlipidemia, unspecified: Secondary | ICD-10-CM

## 2013-07-24 DIAGNOSIS — D509 Iron deficiency anemia, unspecified: Secondary | ICD-10-CM

## 2013-07-24 DIAGNOSIS — E039 Hypothyroidism, unspecified: Secondary | ICD-10-CM

## 2013-07-24 DIAGNOSIS — I119 Hypertensive heart disease without heart failure: Secondary | ICD-10-CM

## 2013-07-24 DIAGNOSIS — I4891 Unspecified atrial fibrillation: Secondary | ICD-10-CM

## 2013-07-24 LAB — CBC WITH DIFFERENTIAL/PLATELET
Basophils Absolute: 0 10*3/uL (ref 0.0–0.1)
Eosinophils Relative: 2.2 % (ref 0.0–5.0)
Hemoglobin: 14 g/dL (ref 12.0–15.0)
Lymphocytes Relative: 27.3 % (ref 12.0–46.0)
Lymphs Abs: 1.4 10*3/uL (ref 0.7–4.0)
Monocytes Absolute: 0.4 10*3/uL (ref 0.1–1.0)
Monocytes Relative: 7.1 % (ref 3.0–12.0)
Neutro Abs: 3.1 10*3/uL (ref 1.4–7.7)
Neutrophils Relative %: 62.4 % (ref 43.0–77.0)
RBC: 4.23 Mil/uL (ref 3.87–5.11)
RDW: 14 % (ref 11.5–14.6)
WBC: 5 10*3/uL (ref 4.5–10.5)

## 2013-07-24 LAB — BASIC METABOLIC PANEL
BUN: 14 mg/dL (ref 6–23)
Calcium: 9.2 mg/dL (ref 8.4–10.5)
Creatinine, Ser: 0.7 mg/dL (ref 0.4–1.2)
GFR: 91.88 mL/min (ref >60.00)
Glucose, Bld: 95 mg/dL (ref 70–99)

## 2013-07-24 LAB — HEPATIC FUNCTION PANEL
AST: 26 U/L (ref 0–37)
Albumin: 4 g/dL (ref 3.5–5.2)
Total Bilirubin: 0.7 mg/dL (ref 0.3–1.2)
Total Protein: 6.8 g/dL (ref 6.0–8.3)

## 2013-07-24 LAB — LIPID PANEL
Cholesterol: 117 mg/dL (ref 0–200)
HDL: 32.9 mg/dL — ABNORMAL LOW (ref >39.00)
Total CHOL/HDL Ratio: 4
Triglycerides: 79 mg/dL (ref 0.0–149.0)

## 2013-07-24 LAB — HEMOGLOBIN A1C: Hgb A1c MFr Bld: 6.1 % (ref 4.6–6.5)

## 2013-07-24 NOTE — Progress Notes (Signed)
Sharon Roy Date of Birth:  [birth year] [address] [place], [place]  [address] [phone]         Fax   [phone]  History of Present Illness: This pleasant 67 year old woman is seen for a scheduled 3 month followup office visit. She has chronic atrial fibrillation and is on Coumadin. She also has a history of dyslipidemia, hypertension, hypothyroidism, and gout.  She has a history of chronic lymphedema of her lower extremities and wears support stockings.  Since last visit she has had less swelling and feels that her legs are improved.  She is getting over an upper respiratory infection and is on Mucinex.  She has had her TDAP,, her flu shot, and her pneumonia shot since we last saw her.   Current Outpatient Prescriptions  Medication Sig Dispense Refill  . allopurinol (ZYLOPRIM) 300 MG tablet TAKE 1 TABLET EVERY DAY  60 tablet  3  . ALPRAZolam (XANAX) 0.5 MG tablet TAKE 1 TABLET 3 TIMES A DAY AS NEEDED  90 tablet  5  . Ascorbic Acid (VITAMIN C) 1000 MG tablet Take 1,000 mg by mouth daily.      Marland Kitchen atorvastatin (LIPITOR) 10 MG tablet TAKE 1 TABLET BY MOUTH EVERY DAY AS DIRECTED  90 tablet  0  . BIOTIN PO Take by mouth.      . Calcium Carbonate-Vitamin D (OSCAL 500/200 D-3 PO) Take by mouth daily.        . Cetirizine HCl (ZYRTEC PO) Take by mouth daily.        . Cholecalciferol (VITAMIN D PO) Take 2,000 Units by mouth daily.        [name] Calcium (STOOL SOFTENER PO) Take by mouth daily.        . ferrous sulfate 325 (65 FE) MG tablet Take 325 mg by mouth daily.       . furosemide (LASIX) 40 MG tablet Take 20 mg by mouth as directed.      Marland Kitchen levothyroxine (SYNTHROID, LEVOTHROID) 75 MCG tablet Take 75 mcg by mouth daily.        . meclizine (ANTIVERT) 25 MG tablet Take 25 mg by mouth as needed.      . metFORMIN (GLUCOPHAGE) 500 MG tablet Take 500 mg by mouth daily with breakfast.      . Multiple Vitamin (MULTIVITAMIN PO) Take by mouth daily.        Marland Kitchen  nystatin-triamcinolone (MYCOLOG II) cream as needed.      . Omega-3 Fatty Acids (FISH OIL) 1000 MG CAPS Take 1,000 mg by mouth 2 (two) times daily.      Marland Kitchen omeprazole (PRILOSEC) 20 MG capsule Take 20 mg by mouth daily.      . propranolol (INDERAL) 20 MG tablet Take 20 mg by mouth 2 (two) times daily.       . ramipril (ALTACE) 5 MG capsule TAKE ONE CAPSULE EVERY DAY  90 capsule  0  . warfarin (COUMADIN) 5 MG tablet Take 1 tablet daily except 1 1/2 tablets on Mondays, Wednesdays and Fridays  45 tablet  3  . Zinc 50 MG TABS Take 50 mg by mouth.       No current facility-administered medications for this visit.    Allergies  Allergen Reactions  . Fexofenadine Hcl   . Penicillins     Patient Active Problem List   Diagnosis Date Noted  . Atrial fibrillation 11/27/2010    Priority: High  . Type II or unspecified type  diabetes mellitus without mention of complication, uncontrolled 01/22/2013  . Alopecia 07/25/2012  . Iron deficiency anemia, unspecified 06/15/2012  . Benign hypertensive heart disease without heart failure 12/22/2010  . Hypothyroidism 12/22/2010  . Dyslipidemia 12/22/2010  . Gout 12/22/2010    History  Smoking status  . Never Smoker   Smokeless tobacco  . Not on file    History  Alcohol Use: Not on file    No family history on file.  Review of Systems: Constitutional: no fever chills diaphoresis or fatigue or change in weight.  Head and neck: no hearing loss, no epistaxis, no photophobia or visual disturbance. Respiratory: No cough, shortness of breath or wheezing. Cardiovascular: No chest pain peripheral edema, palpitations. Gastrointestinal: No abdominal distention, no abdominal pain, no change in bowel habits hematochezia or melena. Genitourinary: No dysuria, no frequency, no urgency, no nocturia. Musculoskeletal:No arthralgias, no back pain, no gait disturbance or myalgias. Neurological: No dizziness, no headaches, no numbness, no seizures, no syncope, no  weakness, no tremors. Hematologic: No lymphadenopathy, no easy bruising. Psychiatric: No confusion, no hallucinations, no sleep disturbance.    Physical Exam: Filed Vitals:   07/24/13 0946  BP: 137/67  Pulse: 80   the general appearance reveals a large woman in no acute distress.  Her weight is up 2 pounds since last visit.The head and neck exam reveals pupils equal and reactive.  Extraocular movements are full.  There is no scleral icterus.  The mouth and pharynx are normal.  The neck is supple.  The carotids reveal no bruits.  The jugular venous pressure is normal.  The  thyroid is not enlarged.  There is no lymphadenopathy.  The chest is clear to percussion and auscultation.  There are no rales or rhonchi.  Expansion of the chest is symmetrical.  The precordium is quiet.  The first heart sound is normal.  The second heart sound is physiologically split.  There is no murmur gallop rub or click.  There is no abnormal lift or heave.  The abdomen is soft and nontender.  The bowel sounds are normal.  The liver and spleen are not enlarged.  There are no abdominal masses.  There are no abdominal bruits.  Extremities reveal good pedal pulses.  There is bilateral lower extremity swelling secondary to lymphedema.  Both of her legs are wrapped tightly up to the knees today.  Patient states that the previous weeping ulcers have cleared up. There is no cyanosis or clubbing.  Strength is normal and symmetrical in all extremities.  There is no lateralizing weakness.  There are no sensory deficits.  The skin is warm and dry.  There is no rash.       Assessment / Plan: Patient will continue same medication.  Work harder on weight loss.  Recheck in 3 months for office visit lipid panel hepatic function panel basal metabolic panel A1c and TSH

## 2013-07-24 NOTE — Progress Notes (Signed)
Quick Note:  Please report to patient. The recent labs are stable. Continue same medication and careful diet. A1C is good at 6.1. CBC normal ______

## 2013-07-24 NOTE — Assessment & Plan Note (Signed)
The patient is in chronic atrial fibrillation.  She has not been experiencing any TIA or stroke symptoms.  She is on long-term warfarin.  She has not been having any evidence of hematochezia or melena.

## 2013-07-24 NOTE — Assessment & Plan Note (Signed)
The patient has not been experiencing any hypoglycemic episodes. 

## 2013-07-24 NOTE — Assessment & Plan Note (Signed)
Blood pressure has been stable since last visit.  Her weight unfortunately is up 5 pounds since last visit.  She has not been experiencing any symptoms of CHF.  No chest pain or palpitations.  She is in chronic atrial fibrillation

## 2013-07-24 NOTE — Assessment & Plan Note (Signed)
The patient is clinically euthyroid on current therapy 

## 2013-07-24 NOTE — Patient Instructions (Signed)
Will obtain labs today and call you with the results (lp/bmet/hfp/cbc/a1c)  Your physician recommends that you continue on your current medications as directed. Please refer to the Current Medication list given to you today.  Your physician recommends that you schedule a follow-up appointment in: 3 months with fasting labs (LP/BMET/HFP/A1C)

## 2013-07-25 ENCOUNTER — Telehealth: Payer: Self-pay | Admitting: Cardiology

## 2013-07-25 ENCOUNTER — Telehealth: Payer: Self-pay | Admitting: *Deleted

## 2013-07-25 NOTE — Telephone Encounter (Signed)
Follow up     Pt returning our call for her lab results, please give her a call back.

## 2013-07-25 NOTE — Telephone Encounter (Signed)
Advised patient of lab results, no medication changes

## 2013-07-25 NOTE — Telephone Encounter (Signed)
Mailed copy of labs and left message to call if any questions Highlighted comments from  Dr. Patty Sermons

## 2013-07-25 NOTE — Telephone Encounter (Signed)
Message copied by Burnell Blanks on Wed Jul 25, 2013  9:20 AM ------      Message from: Cassell Clement      Created: Tue Jul 24, 2013  6:48 PM       Please report to patient.  The recent labs are stable. Continue same medication and careful diet. A1C is good at 6.1.  CBC normal ------

## 2013-08-21 ENCOUNTER — Ambulatory Visit (INDEPENDENT_AMBULATORY_CARE_PROVIDER_SITE_OTHER): Payer: No Typology Code available for payment source | Admitting: *Deleted

## 2013-08-21 DIAGNOSIS — I4891 Unspecified atrial fibrillation: Secondary | ICD-10-CM

## 2013-08-21 DIAGNOSIS — Z7901 Long term (current) use of anticoagulants: Secondary | ICD-10-CM

## 2013-08-21 LAB — POCT INR: INR: 2.4

## 2013-09-01 ENCOUNTER — Other Ambulatory Visit: Payer: Self-pay | Admitting: Cardiology

## 2013-09-07 ENCOUNTER — Other Ambulatory Visit: Payer: Self-pay | Admitting: Cardiology

## 2013-09-10 ENCOUNTER — Other Ambulatory Visit: Payer: Self-pay

## 2013-09-12 ENCOUNTER — Other Ambulatory Visit: Payer: Self-pay | Admitting: Cardiology

## 2013-10-09 ENCOUNTER — Ambulatory Visit (INDEPENDENT_AMBULATORY_CARE_PROVIDER_SITE_OTHER): Payer: No Typology Code available for payment source | Admitting: *Deleted

## 2013-10-09 DIAGNOSIS — I4891 Unspecified atrial fibrillation: Secondary | ICD-10-CM

## 2013-10-09 DIAGNOSIS — Z5181 Encounter for therapeutic drug level monitoring: Secondary | ICD-10-CM

## 2013-10-09 DIAGNOSIS — Z7901 Long term (current) use of anticoagulants: Secondary | ICD-10-CM

## 2013-10-09 LAB — POCT INR: INR: 2.9

## 2013-10-17 ENCOUNTER — Ambulatory Visit: Payer: Medicare Other | Admitting: Cardiology

## 2013-10-17 ENCOUNTER — Other Ambulatory Visit: Payer: Medicare Other

## 2013-10-26 DIAGNOSIS — Z5181 Encounter for therapeutic drug level monitoring: Secondary | ICD-10-CM | POA: Insufficient documentation

## 2013-11-07 ENCOUNTER — Other Ambulatory Visit: Payer: Self-pay

## 2013-11-07 DIAGNOSIS — F419 Anxiety disorder, unspecified: Secondary | ICD-10-CM

## 2013-11-07 MED ORDER — ALPRAZOLAM 0.5 MG PO TABS: ORAL_TABLET | ORAL | Status: AC

## 2013-11-07 NOTE — Telephone Encounter (Signed)
Okay to refill xanax. 

## 2013-11-17 ENCOUNTER — Other Ambulatory Visit: Payer: Self-pay | Admitting: Cardiology

## 2013-11-20 ENCOUNTER — Ambulatory Visit (INDEPENDENT_AMBULATORY_CARE_PROVIDER_SITE_OTHER): Payer: No Typology Code available for payment source | Admitting: *Deleted

## 2013-11-20 DIAGNOSIS — Z5181 Encounter for therapeutic drug level monitoring: Secondary | ICD-10-CM

## 2013-11-20 DIAGNOSIS — Z7901 Long term (current) use of anticoagulants: Secondary | ICD-10-CM

## 2013-11-20 DIAGNOSIS — I4891 Unspecified atrial fibrillation: Secondary | ICD-10-CM

## 2013-11-20 LAB — POCT INR: INR: 2.4

## 2013-11-29 ENCOUNTER — Encounter: Payer: Self-pay | Admitting: Cardiology

## 2013-11-29 ENCOUNTER — Ambulatory Visit (INDEPENDENT_AMBULATORY_CARE_PROVIDER_SITE_OTHER): Payer: No Typology Code available for payment source | Admitting: Cardiology

## 2013-11-29 VITALS — BP 151/80 | HR 91 | Ht 73.0 in | Wt 273.0 lb

## 2013-11-29 DIAGNOSIS — I119 Hypertensive heart disease without heart failure: Secondary | ICD-10-CM

## 2013-11-29 DIAGNOSIS — I4891 Unspecified atrial fibrillation: Secondary | ICD-10-CM

## 2013-11-29 DIAGNOSIS — E1165 Type 2 diabetes mellitus with hyperglycemia: Secondary | ICD-10-CM

## 2013-11-29 DIAGNOSIS — E039 Hypothyroidism, unspecified: Secondary | ICD-10-CM

## 2013-11-29 DIAGNOSIS — D509 Iron deficiency anemia, unspecified: Secondary | ICD-10-CM

## 2013-11-29 DIAGNOSIS — IMO0001 Reserved for inherently not codable concepts without codable children: Secondary | ICD-10-CM

## 2013-11-29 MED ORDER — FUROSEMIDE 40 MG PO TABS: 40.0000 mg | ORAL_TABLET | Freq: Two times a day (BID) | ORAL | Status: AC

## 2013-11-29 NOTE — Assessment & Plan Note (Signed)
Blood pressure is stable on current therapy.

## 2013-11-29 NOTE — Assessment & Plan Note (Signed)
She has not been having any hypoglycemic episodes.

## 2013-11-29 NOTE — Patient Instructions (Signed)
INCREASE LASIX (FUROSEMIDE) TO 40 MG TWICE A DAY, NEW RX SENT TO [facility]  Your physician wants you to follow-up in: 3 months with fasting labs (LP/BMET/HFP/CBC/A1C/TSH)  You will receive a reminder letter in the mail two months in advance. If you don't receive a letter, please call our office to schedule the follow-up appointment.

## 2013-11-29 NOTE — Progress Notes (Signed)
Sharon Roy Date of Birth:  [birth year] [address] [place], [place]  [address] [phone]         Fax   [phone]  History of Present Illness: This pleasant 68 year old woman is seen for a scheduled 3 month followup office visit. She has chronic atrial fibrillation and is on Coumadin. She also has a history of dyslipidemia, hypertension, hypothyroidism, and gout.  She has a history of chronic lymphedema of her lower extremities and wears support stockings.  Since we last saw her she was rehospitalized at [facility] for cellulitis and congestive heart failure.  She was discharged on higher dose of Lasix.  She takes Lasix 40 mg tablets once or twice a day.  She weighs herself each day and if her weight is up more than 3-5 pounds she takes the extra Lasix in the evening.  She was sent home on oxygen but her oximeter is greater than 90% of the time and so she is not using the oxygen.  She is keeping her legs elevated and sits in a recliner.  Her weight is down 15 pounds since last visit.  She wears support hose each day.   Current Outpatient Prescriptions  Medication Sig Dispense Refill  . allopurinol (ZYLOPRIM) 300 MG tablet TAKE 1 TABLET EVERY DAY  60 tablet  3  . ALPRAZolam (XANAX) 0.5 MG tablet TAKE 1 TABLET 3 TIMES A DAY AS NEEDED  90 tablet  5  . Ascorbic Acid (VITAMIN C) 1000 MG tablet Take 1,000 mg by mouth daily.      Marland Kitchen atorvastatin (LIPITOR) 10 MG tablet TAKE 1 TABLET BY MOUTH EVERY DAY AS DIRECTED  90 tablet  0  . atorvastatin (LIPITOR) 10 MG tablet TAKE 1 TABLET BY MOUTH EVERY DAY AS DIRECTED  90 tablet  0  . BIOTIN PO Take by mouth.      . Calcium Carbonate-Vitamin D (OSCAL 500/200 D-3 PO) Take by mouth daily.        . Cetirizine HCl (ZYRTEC PO) Take by mouth daily.        . Cholecalciferol (VITAMIN D PO) Take 2,000 Units by mouth daily.        Mariane Baumgarten Calcium (STOOL SOFTENER PO) Take by mouth daily.        . ferrous sulfate 325 (65 FE) MG tablet  Take 325 mg by mouth daily.       . furosemide (LASIX) 40 MG tablet Take 1 tablet (40 mg total) by mouth 2 (two) times daily.  60 tablet  5  . levothyroxine (SYNTHROID, LEVOTHROID) 75 MCG tablet Take 75 mcg by mouth daily.        . meclizine (ANTIVERT) 25 MG tablet Take 25 mg by mouth as needed.      . metFORMIN (GLUCOPHAGE) 500 MG tablet Take 500 mg by mouth daily with breakfast.      . Multiple Vitamin (MULTIVITAMIN PO) Take by mouth daily.        Marland Kitchen nystatin-triamcinolone (MYCOLOG II) cream as needed.      . Omega-3 Fatty Acids (FISH OIL) 1000 MG CAPS Take 1,000 mg by mouth 2 (two) times daily.      Marland Kitchen omeprazole (PRILOSEC) 20 MG capsule Take 20 mg by mouth daily.      . propranolol (INDERAL) 20 MG tablet Take 20 mg by mouth 2 (two) times daily.       . ramipril (ALTACE) 5 MG capsule TAKE ONE CAPSULE BY MOUTH EVERY DAY  90 capsule  0  . warfarin (COUMADIN) 5 MG tablet TAKE 1 TABLET BY MOUTH DAILY EXCEPT TAKE 1.5 TABLETS ON MONDAYS, WEDNESDAYS, AND FRIDAYS  45 tablet  3  . Zinc 50 MG TABS Take 50 mg by mouth.       No current facility-administered medications for this visit.    Allergies  Allergen Reactions  . Fexofenadine Hcl   . Penicillins     Patient Active Problem List   Diagnosis Date Noted  . Atrial fibrillation 11/27/2010    Priority: High  . Encounter for therapeutic drug monitoring 10/26/2013  . Type II or unspecified type diabetes mellitus without mention of complication, uncontrolled 01/22/2013  . Alopecia 07/25/2012  . Iron deficiency anemia, unspecified 06/15/2012  . Benign hypertensive heart disease without heart failure 12/22/2010  . Hypothyroidism 12/22/2010  . Dyslipidemia 12/22/2010  . Gout 12/22/2010    History  Smoking status  . Never Smoker   Smokeless tobacco  . Not on file    History  Alcohol Use: Not on file    No family history on file.  Review of Systems: Constitutional: no fever chills diaphoresis or fatigue or change in weight.  Head  and neck: no hearing loss, no epistaxis, no photophobia or visual disturbance. Respiratory: No cough, shortness of breath or wheezing. Cardiovascular: No chest pain peripheral edema, palpitations. Gastrointestinal: No abdominal distention, no abdominal pain, no change in bowel habits hematochezia or melena. Genitourinary: No dysuria, no frequency, no urgency, no nocturia. Musculoskeletal:No arthralgias, no back pain, no gait disturbance or myalgias. Neurological: No dizziness, no headaches, no numbness, no seizures, no syncope, no weakness, no tremors. Hematologic: No lymphadenopathy, no easy bruising. Psychiatric: No confusion, no hallucinations, no sleep disturbance.    Physical Exam: Filed Vitals:   11/29/13 0916  BP: 151/80  Pulse: 91   the general appearance reveals a large woman in no acute distress.  Her weight is up 2 pounds since last visit.The head and neck exam reveals pupils equal and reactive.  Extraocular movements are full.  There is no scleral icterus.  The mouth and pharynx are normal.  The neck is supple.  The carotids reveal no bruits.  The jugular venous pressure is normal.  The  thyroid is not enlarged.  There is no lymphadenopathy.  The chest is clear to percussion and auscultation.  There are no rales or rhonchi.  Expansion of the chest is symmetrical.  The precordium is quiet.  The first heart sound is normal.  The second heart sound is physiologically split.  There is no murmur gallop rub or click.  There is no abnormal lift or heave.  The abdomen is soft and nontender.  The bowel sounds are normal.  The liver and spleen are not enlarged.  There are no abdominal masses.  There are no abdominal bruits.  Extremities reveal good pedal pulses.  The patient is wearing support stockings. There is no cyanosis or clubbing.  Strength is normal and symmetrical in all extremities.  There is no lateralizing weakness.  There are no sensory deficits.  The skin is warm and dry.  There is  no rash.       Assessment / Plan: The patient is doing better.  Her dyspnea is improved.  She is tolerating the higher dose of Lasix.  She had recent lab work which showed normal renal function.  She will continue current medication.  We will skip labs today.  She will return in 3 months for office visit CBC  lipid panel hepatic function panel basal metabolic panel B4W and TSH.

## 2013-11-29 NOTE — Assessment & Plan Note (Signed)
She is in chronic atrial fibrillation.  Her Coumadin is checked at the [facility] office.  She has not had any TIA or stroke symptoms

## 2013-11-30 ENCOUNTER — Other Ambulatory Visit: Payer: Self-pay | Admitting: Cardiology

## 2013-12-24 ENCOUNTER — Other Ambulatory Visit: Payer: Self-pay | Admitting: Cardiology

## 2014-01-01 ENCOUNTER — Ambulatory Visit (INDEPENDENT_AMBULATORY_CARE_PROVIDER_SITE_OTHER): Payer: No Typology Code available for payment source | Admitting: *Deleted

## 2014-01-01 DIAGNOSIS — Z7901 Long term (current) use of anticoagulants: Secondary | ICD-10-CM

## 2014-01-01 DIAGNOSIS — Z5181 Encounter for therapeutic drug level monitoring: Secondary | ICD-10-CM

## 2014-01-01 DIAGNOSIS — I4891 Unspecified atrial fibrillation: Secondary | ICD-10-CM

## 2014-01-01 LAB — POCT INR: INR: 4

## 2014-01-15 ENCOUNTER — Ambulatory Visit (INDEPENDENT_AMBULATORY_CARE_PROVIDER_SITE_OTHER): Payer: No Typology Code available for payment source | Admitting: *Deleted

## 2014-01-15 DIAGNOSIS — Z7901 Long term (current) use of anticoagulants: Secondary | ICD-10-CM

## 2014-01-15 DIAGNOSIS — Z5181 Encounter for therapeutic drug level monitoring: Secondary | ICD-10-CM

## 2014-01-15 DIAGNOSIS — I4891 Unspecified atrial fibrillation: Secondary | ICD-10-CM

## 2014-01-15 LAB — POCT INR: INR: 2.5

## 2014-01-17 ENCOUNTER — Other Ambulatory Visit: Payer: Self-pay

## 2014-01-17 DIAGNOSIS — Z1231 Encounter for screening mammogram for malignant neoplasm of breast: Secondary | ICD-10-CM

## 2014-02-04 ENCOUNTER — Ambulatory Visit
Admission: RE | Admit: 2014-02-04 | Discharge: 2014-02-04 | Disposition: A | Discharge status: Home / Self Care | Payer: Medicare Other | Source: Ambulatory Visit

## 2014-02-04 DIAGNOSIS — Z1231 Encounter for screening mammogram for malignant neoplasm of breast: Secondary | ICD-10-CM

## 2014-02-05 ENCOUNTER — Other Ambulatory Visit: Payer: Self-pay | Admitting: Specialist

## 2014-02-05 DIAGNOSIS — R928 Other abnormal and inconclusive findings on diagnostic imaging of breast: Secondary | ICD-10-CM

## 2014-02-12 ENCOUNTER — Ambulatory Visit (INDEPENDENT_AMBULATORY_CARE_PROVIDER_SITE_OTHER): Payer: Medicare Other | Admitting: *Deleted

## 2014-02-12 DIAGNOSIS — I4891 Unspecified atrial fibrillation: Secondary | ICD-10-CM

## 2014-02-12 DIAGNOSIS — Z5181 Encounter for therapeutic drug level monitoring: Secondary | ICD-10-CM

## 2014-02-12 DIAGNOSIS — Z7901 Long term (current) use of anticoagulants: Secondary | ICD-10-CM

## 2014-02-12 LAB — POCT INR: INR: 3

## 2014-02-14 ENCOUNTER — Ambulatory Visit
Admission: RE | Admit: 2014-02-14 | Discharge: 2014-02-14 | Disposition: A | Discharge status: Home / Self Care | Payer: Medicare Other | Source: Ambulatory Visit | Attending: Specialist | Admitting: Specialist

## 2014-02-14 ENCOUNTER — Other Ambulatory Visit: Payer: Self-pay | Admitting: Specialist

## 2014-02-14 DIAGNOSIS — R928 Other abnormal and inconclusive findings on diagnostic imaging of breast: Secondary | ICD-10-CM

## 2014-02-14 DIAGNOSIS — N6310 Unspecified lump in the right breast, unspecified quadrant: Secondary | ICD-10-CM

## 2014-02-14 IMAGING — MG MM DIGITAL DIAGNOSTIC UNILAT*R*
8 series · 8 of 8 positions shown · non-contrast
Comparison: Previous mammograms.

ADDENDUM:
The patient returns today for biopsy of the right breast. However,
she is on Coumadin. Last INR was 3.0 on [DATE]. On [DATE],
she was given a 7 day course of doxycycline for a possible spider
bite. She has completed a course of doxycycline. The biopsy will be
postponed to check INR prior to procedure. Biopsy has been
rescheduled.
CLINICAL DATA: Screening recall for a right breast mass.

EXAM:
DIGITAL DIAGNOSTIC  RIGHT MAMMOGRAM WITH CAD
ULTRASOUND RIGHT BREAST

[R CC (1 of 2)]
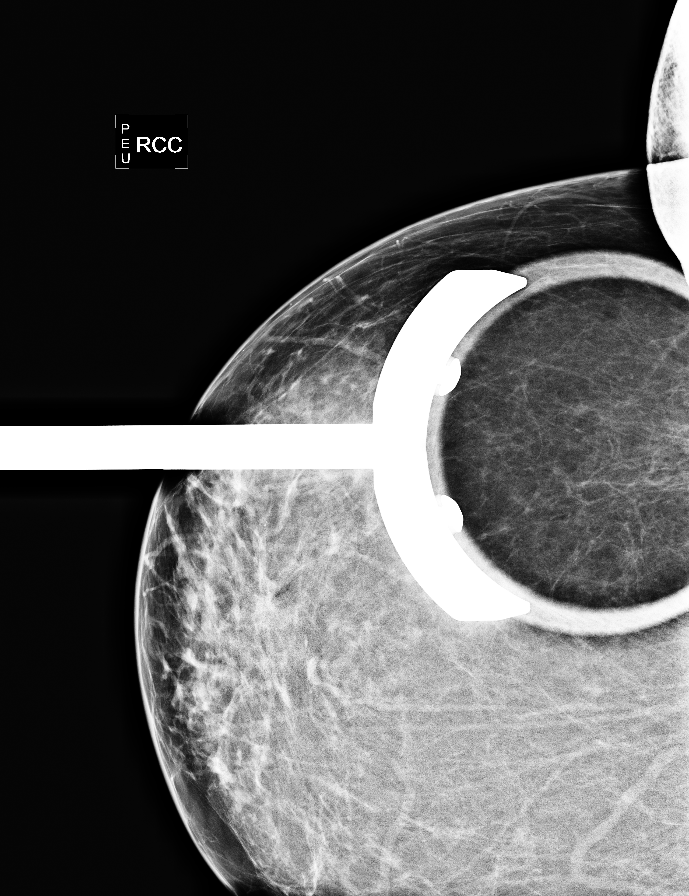

[R CC (2 of 2)]
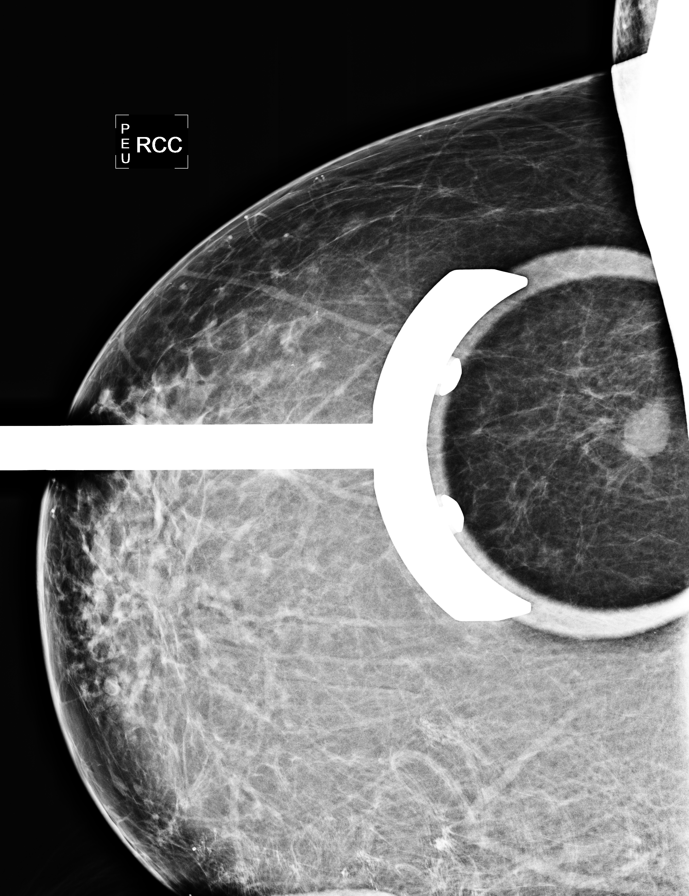

[R ML]
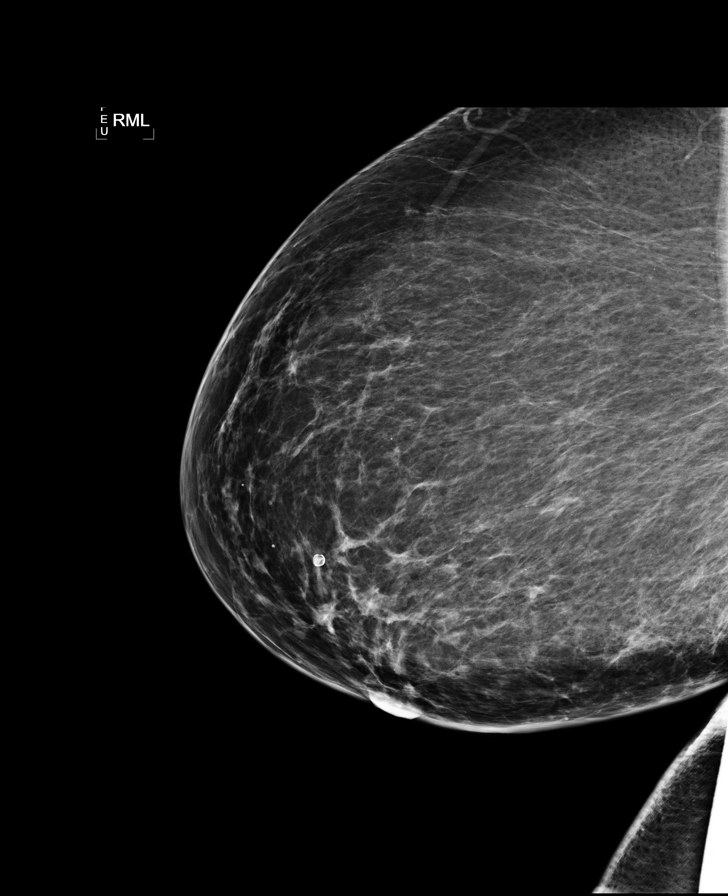

[R XCCL]
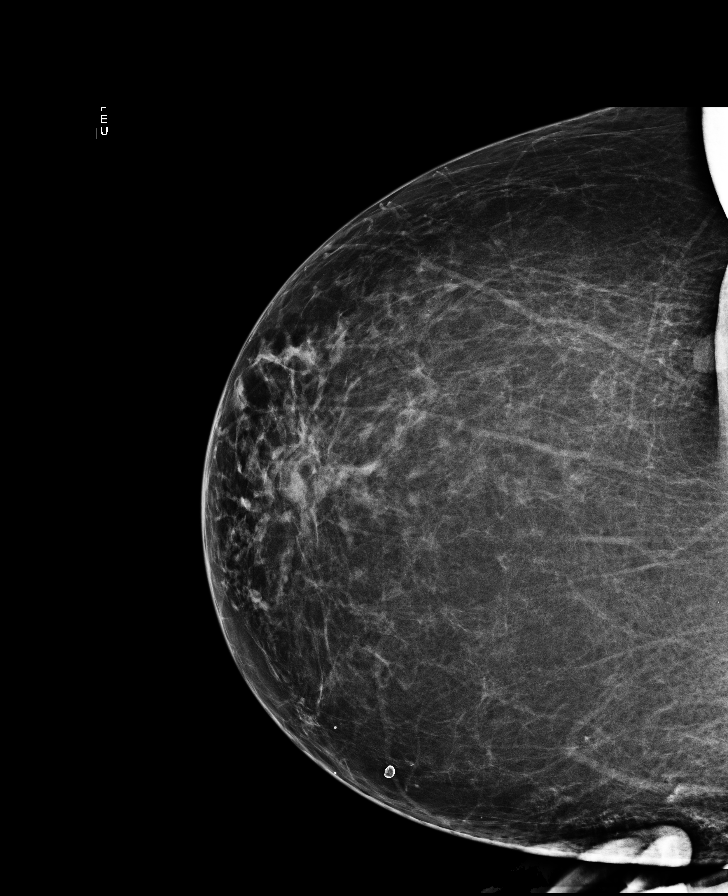

[R LM (1 of 4)]
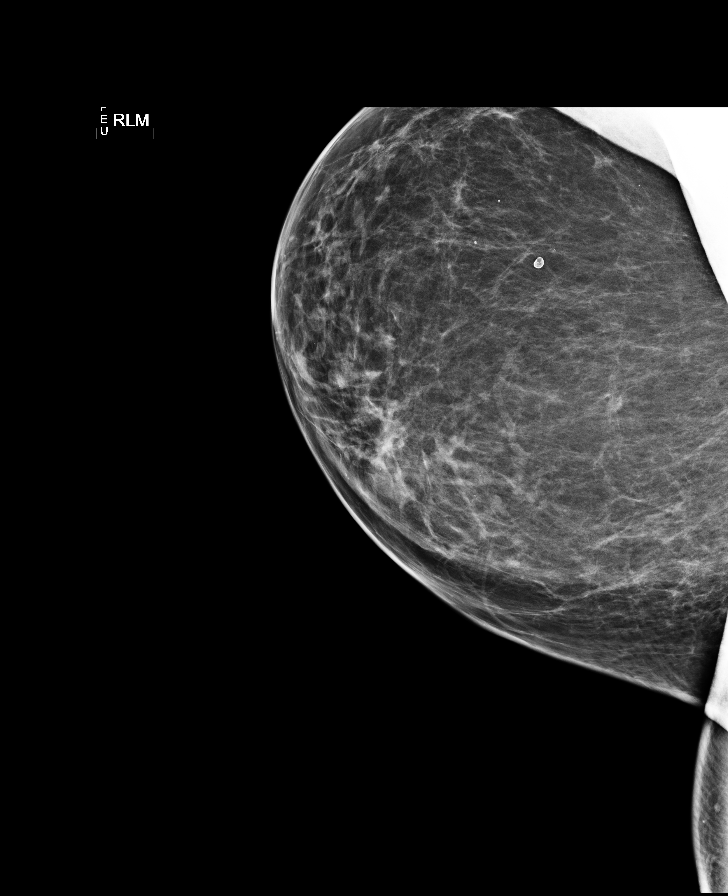

[R LM (2 of 4)]
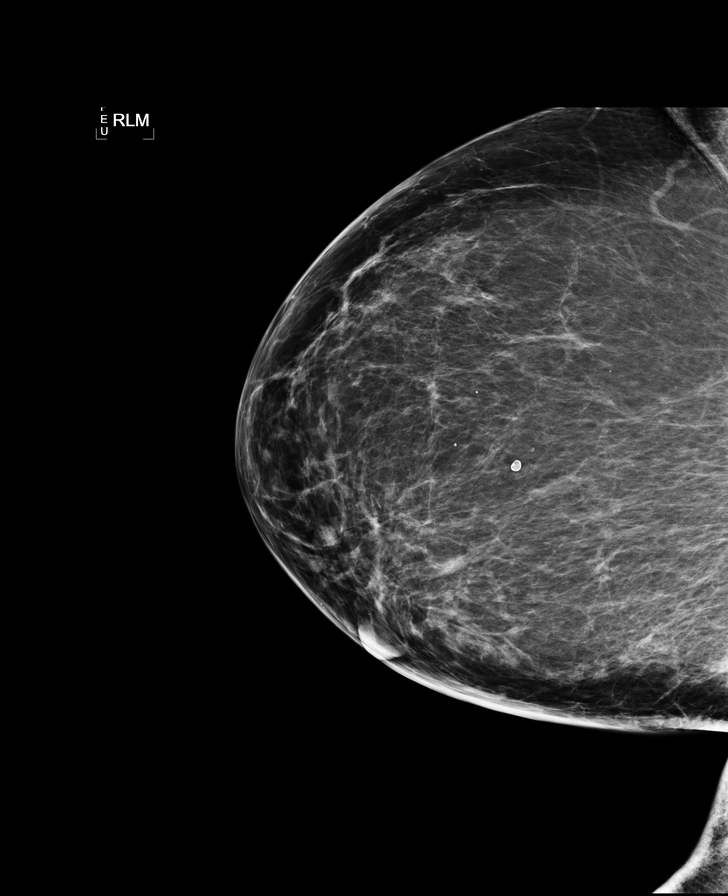

[R LM (3 of 4)]
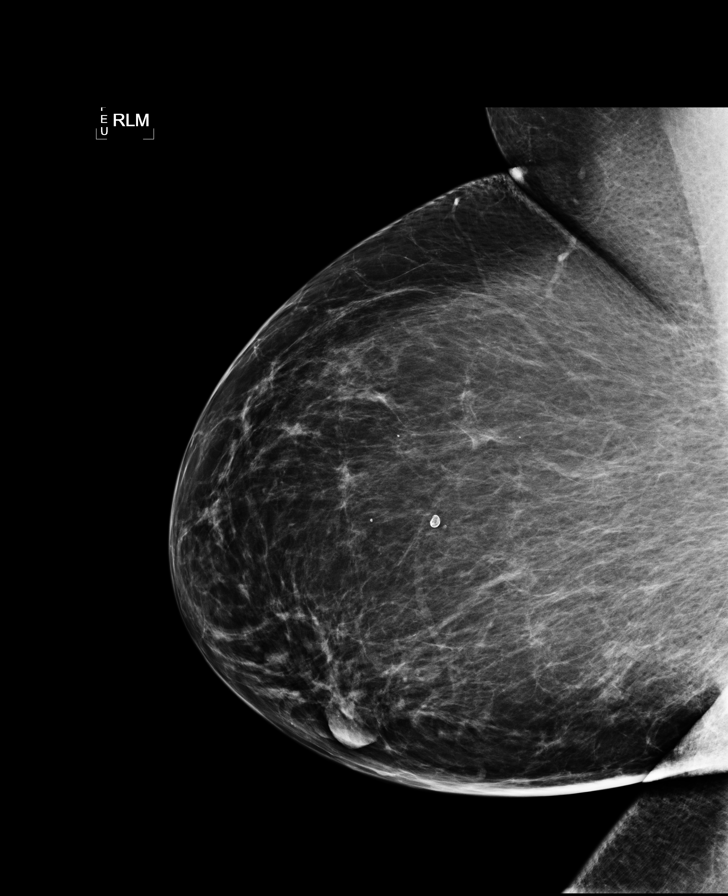

[R LM (4 of 4)]
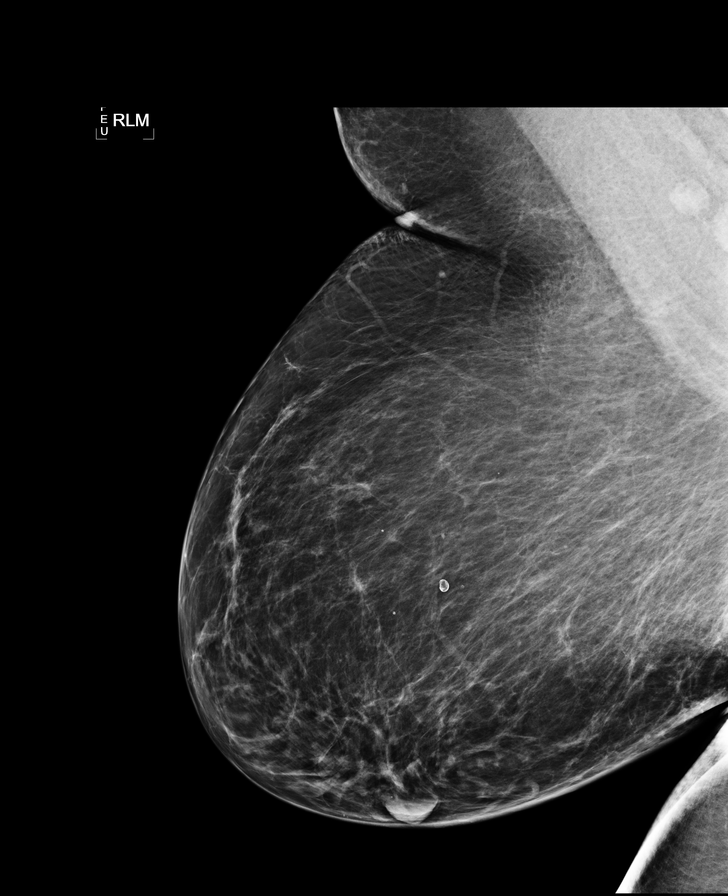

[8 of 8 positions shown; findings below may reference images not displayed]

ACR Breast Density Category b: There are scattered areas of
fibroglandular density.
FINDINGS: There is a round circumscribed dense mass in the slightly outer
right breast confirmed on the additional spot compression CC view
measuring 1.3 cm. The additional imaging places this mass likely
within the inferior right breast as based on the LM view.

Mammographic images were processed with CAD.

Physical examination of the outer right breast does not reveal any
palpable masses.

Targeted ultrasound of the right breast was performed demonstrating
an oval mixed echogenicity predominantly hypoechoic mass which
appears fairly well-circumscribed at 630 13 cm from nipple in the
region of the inframammary fold measuring 1.1 x 0.8 x 1.1 cm. This
corresponds with mammography findings. No lymphadenopathy is seen in
the right axilla.
IMPRESSION: Indeterminate mass in the right breast.

RECOMMENDATION:
Ultrasound-guided biopsy of the indeterminate mass in the right
breast is recommended. This is scheduled for [DATE] at 10 a.m..

I have discussed the findings and recommendations with the patient.
Results were also provided in writing at the conclusion of the
visit. If applicable, a reminder letter will be sent to the patient
regarding the next appointment.

BI-RADS CATEGORY  4: Suspicious.

## 2014-02-21 ENCOUNTER — Other Ambulatory Visit: Payer: Self-pay | Admitting: Specialist

## 2014-02-21 ENCOUNTER — Ambulatory Visit
Admission: RE | Admit: 2014-02-21 | Discharge: 2014-02-21 | Disposition: A | Discharge status: Home / Self Care | Payer: Medicare Other | Source: Ambulatory Visit | Attending: Specialist | Admitting: Specialist

## 2014-02-21 DIAGNOSIS — N6310 Unspecified lump in the right breast, unspecified quadrant: Secondary | ICD-10-CM

## 2014-02-26 ENCOUNTER — Ambulatory Visit (INDEPENDENT_AMBULATORY_CARE_PROVIDER_SITE_OTHER): Payer: Medicare Other | Admitting: *Deleted

## 2014-02-26 DIAGNOSIS — I4891 Unspecified atrial fibrillation: Secondary | ICD-10-CM

## 2014-02-26 DIAGNOSIS — Z5181 Encounter for therapeutic drug level monitoring: Secondary | ICD-10-CM

## 2014-02-26 DIAGNOSIS — Z7901 Long term (current) use of anticoagulants: Secondary | ICD-10-CM

## 2014-02-26 LAB — POCT INR: INR: 2.1

## 2014-02-27 ENCOUNTER — Encounter: Payer: Self-pay | Admitting: *Deleted

## 2014-02-28 ENCOUNTER — Ambulatory Visit (INDEPENDENT_AMBULATORY_CARE_PROVIDER_SITE_OTHER): Payer: Medicare Other | Admitting: Cardiology

## 2014-02-28 ENCOUNTER — Encounter: Payer: Self-pay | Admitting: Cardiology

## 2014-02-28 ENCOUNTER — Other Ambulatory Visit: Payer: Medicare Other

## 2014-02-28 VITALS — BP 114/90 | HR 80 | Ht 73.0 in | Wt 274.4 lb

## 2014-02-28 DIAGNOSIS — IMO0001 Reserved for inherently not codable concepts without codable children: Secondary | ICD-10-CM

## 2014-02-28 DIAGNOSIS — D509 Iron deficiency anemia, unspecified: Secondary | ICD-10-CM

## 2014-02-28 DIAGNOSIS — E785 Hyperlipidemia, unspecified: Secondary | ICD-10-CM

## 2014-02-28 DIAGNOSIS — I119 Hypertensive heart disease without heart failure: Secondary | ICD-10-CM

## 2014-02-28 DIAGNOSIS — E039 Hypothyroidism, unspecified: Secondary | ICD-10-CM

## 2014-02-28 DIAGNOSIS — E1165 Type 2 diabetes mellitus with hyperglycemia: Secondary | ICD-10-CM

## 2014-02-28 DIAGNOSIS — I4891 Unspecified atrial fibrillation: Secondary | ICD-10-CM

## 2014-02-28 LAB — CBC WITH DIFFERENTIAL/PLATELET
BASOS PCT: 0.4 % (ref 0.0–3.0)
Basophils Absolute: 0 10*3/uL (ref 0.0–0.1)
EOS PCT: 3.4 % (ref 0.0–5.0)
Eosinophils Absolute: 0.2 10*3/uL (ref 0.0–0.7)
HCT: 42.7 % (ref 36.0–46.0)
HEMOGLOBIN: 14.3 g/dL (ref 12.0–15.0)
LYMPHS PCT: 26.2 % (ref 12.0–46.0)
Lymphs Abs: 1.2 10*3/uL (ref 0.7–4.0)
MCHC: 33.5 g/dL (ref 30.0–36.0)
MCV: 99.5 fl (ref 78.0–100.0)
MONO ABS: 0.4 10*3/uL (ref 0.1–1.0)
Monocytes Relative: 8.5 % (ref 3.0–12.0)
Neutro Abs: 2.7 10*3/uL (ref 1.4–7.7)
Neutrophils Relative %: 61.5 % (ref 43.0–77.0)
Platelets: 141 10*3/uL — ABNORMAL LOW (ref 150.0–400.0)
RBC: 4.29 Mil/uL (ref 3.87–5.11)
RDW: 13.4 % (ref 11.5–15.5)
WBC: 4.5 10*3/uL (ref 4.0–10.5)

## 2014-02-28 LAB — HEMOGLOBIN A1C: Hgb A1c MFr Bld: 6.1 % (ref 4.6–6.5)

## 2014-02-28 LAB — HEPATIC FUNCTION PANEL
ALT: 24 U/L (ref 0–35)
AST: 28 U/L (ref 0–37)
Albumin: 3.9 g/dL (ref 3.5–5.2)
Alkaline Phosphatase: 71 U/L (ref 39–117)
BILIRUBIN DIRECT: 0.1 mg/dL (ref 0.0–0.3)
BILIRUBIN TOTAL: 1.2 mg/dL (ref 0.2–1.2)
Total Protein: 6.8 g/dL (ref 6.0–8.3)

## 2014-02-28 LAB — BASIC METABOLIC PANEL
BUN: 11 mg/dL (ref 6–23)
CO2: 37 mEq/L — ABNORMAL HIGH (ref 19–32)
Calcium: 9.4 mg/dL (ref 8.4–10.5)
Chloride: 99 mEq/L (ref 96–112)
Creatinine, Ser: 0.8 mg/dL (ref 0.4–1.2)
GFR: 74.95 mL/min (ref >60.00)
Glucose, Bld: 111 mg/dL — ABNORMAL HIGH (ref 70–99)
Potassium: 3.7 mEq/L (ref 3.5–5.1)
Sodium: 141 mEq/L (ref 135–145)

## 2014-02-28 LAB — LIPID PANEL
CHOL/HDL RATIO: 3
Cholesterol: 117 mg/dL (ref 0–200)
HDL: 35.5 mg/dL — AB (ref >39.00)
LDL CALC: 67 mg/dL (ref 0–99)
NONHDL: 81.5
Triglycerides: 75 mg/dL (ref 0.0–149.0)
VLDL: 15 mg/dL (ref 0.0–40.0)

## 2014-02-28 LAB — TSH: TSH: 0.48 u[IU]/mL (ref 0.35–4.50)

## 2014-02-28 NOTE — Assessment & Plan Note (Signed)
She is in permanent atrial fibrillation, on warfarin.  She has not had any TIA symptoms.

## 2014-02-28 NOTE — Patient Instructions (Signed)
Will obtain labs today and call you with the results (lp/bmet/hfp/cbc/tsh/a1c)  Your physician recommends that you schedule a follow-up appointment in: 3 month ov/ekg  Your physician recommends that you continue on your current medications as directed. Please refer to the Current Medication list given to you today.

## 2014-02-28 NOTE — Progress Notes (Signed)
Sharon Roy Date of Birth:  1946/02/19 [facility] [address] [place], [place]  [address] [phone]        Fax   [phone]   History of Present Illness: This pleasant 68 year old woman is seen for a scheduled 3 month followup office visit. She has chronic atrial fibrillation and is on Coumadin. She also has a history of dyslipidemia, hypertension, hypothyroidism, and gout. She has a history of chronic lymphedema of her lower extremities and wears support stockings. Since we last saw her she was rehospitalized at [facility] for cellulitis and congestive heart failure. She was discharged on higher dose of Lasix. She takes Lasix 40 mg tablets once or twice a day. She weighs herself each day and if her weight is up more than 3-5 pounds she takes the extra Lasix in the evening. She was sent home on oxygen but her oximeter is greater than 90% of the time and so she is not using the oxygen. She is keeping her legs elevated and sits in a recliner. Her weight is down 15 pounds since last visit. She wears support hose each day.  Since we saw her she has had an abnormal mammogram.  She is scheduled for another biopsy of a nodule in the right breast in the near future.  The initial biopsy had to be postponed because of an eye infection for which she was treated with doxycycline which affected her Coumadin   Current Outpatient Prescriptions  Medication Sig Dispense Refill  . allopurinol (ZYLOPRIM) 300 MG tablet TAKE 1 TABLET EVERY DAY  60 tablet  3  . ALPRAZolam (XANAX) 0.5 MG tablet TAKE 1 TABLET 3 TIMES A DAY AS NEEDED  90 tablet  5  . Ascorbic Acid (VITAMIN C) 1000 MG tablet Take 1,000 mg by mouth daily.      Marland Kitchen atorvastatin (LIPITOR) 10 MG tablet TAKE 1 TABLET BY MOUTH EVERY DAY AS DIRECTED  90 tablet  0  . BIOTIN PO Take by mouth.      . Cetirizine HCl (ZYRTEC PO) Take by mouth daily.        Mariane Baumgarten Calcium (STOOL SOFTENER PO) Take by mouth daily.        .  ferrous sulfate 325 (65 FE) MG tablet Take 325 mg by mouth daily.       . furosemide (LASIX) 40 MG tablet Take 1 tablet (40 mg total) by mouth 2 (two) times daily.  60 tablet  5  . levothyroxine (SYNTHROID, LEVOTHROID) 75 MCG tablet Take 75 mcg by mouth daily.        . meclizine (ANTIVERT) 25 MG tablet Take 25 mg by mouth as needed.      . metFORMIN (GLUCOPHAGE) 500 MG tablet Take 500 mg by mouth daily with breakfast.      . Multiple Vitamin (MULTIVITAMIN PO) Take by mouth daily.        Marland Kitchen nystatin-triamcinolone (MYCOLOG II) cream as needed.      Marland Kitchen omeprazole (PRILOSEC) 20 MG capsule Take 20 mg by mouth daily.      . propranolol (INDERAL) 20 MG tablet Take 20 mg by mouth 2 (two) times daily.       . ramipril (ALTACE) 5 MG capsule TAKE ONE CAPSULE EVERY DAY (NEEDS OFFICE VISIT)  90 capsule  0  . warfarin (COUMADIN) 5 MG tablet TAKE 1 TABLET BY MOUTH DAILY EXCEPT TAKE 1.5 TABLETS ON MONDAYS, WEDNESDAYS, AND FRIDAYS  45 tablet  3  .  Zinc 50 MG TABS Take 50 mg by mouth.       No current facility-administered medications for this visit.    Allergies  Allergen Reactions  . Fexofenadine Hcl   . Penicillins     Patient Active Problem List   Diagnosis Date Noted  . Atrial fibrillation 11/27/2010    Priority: High  . Encounter for therapeutic drug monitoring 10/26/2013  . Type II or unspecified type diabetes mellitus without mention of complication, uncontrolled 01/22/2013  . Alopecia 07/25/2012  . Iron deficiency anemia, unspecified 06/15/2012  . Benign hypertensive heart disease without heart failure 12/22/2010  . Hypothyroidism 12/22/2010  . Dyslipidemia 12/22/2010  . Gout 12/22/2010    History  Smoking status  . Never Smoker   Smokeless tobacco  . Not on file    History  Alcohol Use: Not on file    No family history on file.  Review of Systems: Constitutional: no fever chills diaphoresis or fatigue or change in weight.  Head and neck: no hearing loss, no epistaxis, no  photophobia or visual disturbance. Respiratory: No cough, shortness of breath or wheezing. Cardiovascular: No chest pain peripheral edema, palpitations. Gastrointestinal: No abdominal distention, no abdominal pain, no change in bowel habits hematochezia or melena. Genitourinary: No dysuria, no frequency, no urgency, no nocturia. Musculoskeletal:No arthralgias, no back pain, no gait disturbance or myalgias. Neurological: No dizziness, no headaches, no numbness, no seizures, no syncope, no weakness, no tremors. Hematologic: No lymphadenopathy, no easy bruising. Psychiatric: No confusion, no hallucinations, no sleep disturbance.    Physical Exam: Filed Vitals:   02/28/14 0816  BP: 114/90  Pulse: 80   the general appearance reveals a large woman in no acute distress.  She has persistent swelling below the right eye.  This is felt to be secondary to her previous spider bite and she has already received 1 course of doxycycline.The head and neck exam reveals pupils equal and reactive.  Extraocular movements are full.  There is no scleral icterus.  The mouth and pharynx are normal.  The neck is supple.  The carotids reveal no bruits.  The jugular venous pressure is normal.  The  thyroid is not enlarged.  There is no lymphadenopathy.  The chest is clear to percussion and auscultation.  There are no rales or rhonchi.  Expansion of the chest is symmetrical.  The precordium is quiet.  The pulse is irregular The first heart sound is normal.  The second heart sound is physiologically split.  There is no murmur gallop rub or click.  There is no abnormal lift or heave.  The abdomen is soft and nontender.  The bowel sounds are normal.  The liver and spleen are not enlarged.  There are no abdominal masses.  There are no abdominal bruits.  Extremities reveal good pedal pulses.  There is mild peripheral edema.  There is no cyanosis or clubbing.  Strength is normal and symmetrical in all extremities.  There is no  lateralizing weakness.  There are no sensory deficits.  The skin is warm and dry.  There is no rash.     Assessment / Plan: 1.  Permanent atrial fibrillation on warfarin 2. essential hypertension 3. Dyslipidemia 4. history of gout 5. history of hypothyroidism 6. chronic lymphedema of lower extremities  Disposition continue on same medication.  We're checking full labs today.  Recheck in 3 months for office visit and EKG. Await results of pending breast biopsy.

## 2014-02-28 NOTE — Assessment & Plan Note (Signed)
Patient has not had any hypoglycemic episodes

## 2014-02-28 NOTE — Assessment & Plan Note (Signed)
No symptoms of CHF or chest pain or angina

## 2014-02-28 NOTE — Assessment & Plan Note (Signed)
Patient is on atorvastatin.  We're checking labs today.  She is not having any myalgias

## 2014-02-28 NOTE — Progress Notes (Signed)
Quick Note:  Please report to patient. The recent labs are stable. Continue same medication and careful diet. A1C is 6.1 stable ______

## 2014-03-01 ENCOUNTER — Ambulatory Visit
Admission: RE | Admit: 2014-03-01 | Discharge: 2014-03-01 | Disposition: A | Discharge status: Home / Self Care | Payer: Medicare Other | Source: Ambulatory Visit | Attending: Specialist | Admitting: Specialist

## 2014-03-01 ENCOUNTER — Telehealth: Payer: Self-pay | Admitting: Cardiology

## 2014-03-01 NOTE — Telephone Encounter (Signed)
New message ° ° ° ° °Returning a nurses call to get lab results °

## 2014-03-01 NOTE — Telephone Encounter (Signed)
Advised patient of lab results   Notes Recorded by Darlin Coco, MD on 02/28/2014 at 5:17 PM Please report to patient. The recent labs are stable. Continue same medication and careful diet. A1C is 6.1 stable

## 2014-03-06 ENCOUNTER — Other Ambulatory Visit: Payer: Self-pay | Admitting: Cardiology

## 2014-03-13 ENCOUNTER — Telehealth: Payer: Self-pay | Admitting: Cardiology

## 2014-03-13 MED ORDER — PROPRANOLOL HCL 40 MG PO TABS: 40.0000 mg | ORAL_TABLET | Freq: Two times a day (BID) | ORAL | Status: DC

## 2014-03-13 NOTE — Telephone Encounter (Signed)
Pt called to report a medication error in the  list given the day of the last office visit with Dr. Mare Ferrari on 7/23 rd. The medication Propranolol she is taking 40 mg twice a day instead of 20 mg. Medication error was corrected. Pt is aware.

## 2014-03-13 NOTE — Telephone Encounter (Signed)
New message    Having a procedure done last week.     Notice a mistake on paperwork when she was in the office 7/23.

## 2014-03-23 ENCOUNTER — Other Ambulatory Visit: Payer: Self-pay | Admitting: Cardiology

## 2014-03-25 ENCOUNTER — Other Ambulatory Visit: Payer: Self-pay | Admitting: Cardiology

## 2014-03-26 ENCOUNTER — Ambulatory Visit (INDEPENDENT_AMBULATORY_CARE_PROVIDER_SITE_OTHER): Payer: Medicare Other | Admitting: *Deleted

## 2014-03-26 DIAGNOSIS — Z5181 Encounter for therapeutic drug level monitoring: Secondary | ICD-10-CM

## 2014-03-26 DIAGNOSIS — I4891 Unspecified atrial fibrillation: Secondary | ICD-10-CM

## 2014-03-26 DIAGNOSIS — Z7901 Long term (current) use of anticoagulants: Secondary | ICD-10-CM

## 2014-03-26 LAB — POCT INR: INR: 2

## 2014-04-16 ENCOUNTER — Ambulatory Visit (INDEPENDENT_AMBULATORY_CARE_PROVIDER_SITE_OTHER): Payer: Medicare Other | Admitting: *Deleted

## 2014-04-16 DIAGNOSIS — I4891 Unspecified atrial fibrillation: Secondary | ICD-10-CM

## 2014-04-16 DIAGNOSIS — Z7901 Long term (current) use of anticoagulants: Secondary | ICD-10-CM

## 2014-04-16 DIAGNOSIS — Z5181 Encounter for therapeutic drug level monitoring: Secondary | ICD-10-CM

## 2014-04-16 LAB — POCT INR: INR: 2.5

## 2014-04-22 ENCOUNTER — Other Ambulatory Visit: Payer: Self-pay | Admitting: Cardiology

## 2014-05-14 ENCOUNTER — Ambulatory Visit (INDEPENDENT_AMBULATORY_CARE_PROVIDER_SITE_OTHER): Payer: Medicare Other | Admitting: Pharmacist Clinician (PhC)/ Clinical Pharmacy Specialist

## 2014-05-14 DIAGNOSIS — Z7901 Long term (current) use of anticoagulants: Secondary | ICD-10-CM

## 2014-05-14 DIAGNOSIS — Z5181 Encounter for therapeutic drug level monitoring: Secondary | ICD-10-CM

## 2014-05-14 DIAGNOSIS — I4891 Unspecified atrial fibrillation: Secondary | ICD-10-CM

## 2014-05-14 LAB — POCT INR: INR: 3.1

## 2014-05-20 ENCOUNTER — Other Ambulatory Visit: Payer: Self-pay | Admitting: *Deleted

## 2014-05-20 DIAGNOSIS — C50911 Malignant neoplasm of unspecified site of right female breast: Secondary | ICD-10-CM

## 2014-05-20 DIAGNOSIS — I119 Hypertensive heart disease without heart failure: Secondary | ICD-10-CM

## 2014-05-20 DIAGNOSIS — I4891 Unspecified atrial fibrillation: Secondary | ICD-10-CM

## 2014-05-28 ENCOUNTER — Encounter: Payer: Self-pay | Admitting: Cardiovascular Disease

## 2014-05-31 ENCOUNTER — Ambulatory Visit: Payer: Medicare Other | Admitting: Cardiology

## 2014-06-04 ENCOUNTER — Ambulatory Visit (INDEPENDENT_AMBULATORY_CARE_PROVIDER_SITE_OTHER): Payer: Medicare Other | Admitting: *Deleted

## 2014-06-04 ENCOUNTER — Encounter: Payer: Self-pay | Admitting: Cardiovascular Disease

## 2014-06-04 ENCOUNTER — Ambulatory Visit (INDEPENDENT_AMBULATORY_CARE_PROVIDER_SITE_OTHER): Payer: Medicare Other | Admitting: Cardiovascular Disease

## 2014-06-04 VITALS — BP 126/75 | HR 72 | Ht 73.0 in | Wt 252.0 lb

## 2014-06-04 DIAGNOSIS — E038 Other specified hypothyroidism: Secondary | ICD-10-CM

## 2014-06-04 DIAGNOSIS — I4891 Unspecified atrial fibrillation: Secondary | ICD-10-CM

## 2014-06-04 DIAGNOSIS — E785 Hyperlipidemia, unspecified: Secondary | ICD-10-CM

## 2014-06-04 DIAGNOSIS — Z5181 Encounter for therapeutic drug level monitoring: Secondary | ICD-10-CM

## 2014-06-04 DIAGNOSIS — I890 Lymphedema, not elsewhere classified: Secondary | ICD-10-CM

## 2014-06-04 DIAGNOSIS — I119 Hypertensive heart disease without heart failure: Secondary | ICD-10-CM

## 2014-06-04 DIAGNOSIS — I5032 Chronic diastolic (congestive) heart failure: Secondary | ICD-10-CM

## 2014-06-04 DIAGNOSIS — I10 Essential (primary) hypertension: Secondary | ICD-10-CM

## 2014-06-04 DIAGNOSIS — Z7901 Long term (current) use of anticoagulants: Secondary | ICD-10-CM

## 2014-06-04 LAB — POCT INR: INR: 4.2

## 2014-06-04 MED ORDER — PROPRANOLOL HCL 40 MG PO TABS: ORAL_TABLET | ORAL | Status: AC

## 2014-06-04 NOTE — Patient Instructions (Signed)
   Increase Propranolol to 80mg  every morning & 40mg  every evening - new sent to pharm Continue all other medications.   Nurse visit for EKG in 4-6 weeks Your physician wants you to follow up in: 6 months.  You will receive a reminder letter in the mail one-two months in advance.  If you don't receive a letter, please call our office to schedule the follow up appointment

## 2014-06-04 NOTE — Progress Notes (Signed)
Patient ID: Sharon Roy, female   DOB: 06-22-1946, 68 y.o.   MRN: [id number]      SUBJECTIVE: The patient is a 68 year old woman who normally sees Dr. Mare Ferrari. She has a history of chronic atrial fibrillation and is anticoagulated with warfarin. She also has a history of dyslipidemia, hypertension, hypothyroidism, gout, chronic lymphedema of her lower extremities for which she wears support stockings, chronic diastolic heart failure, and type 2 diabetes mellitus. She weighs herself daily and takes an extra Lasix if her weight is up more than 3-5 pounds. The most recent echocardiogram I find is dated 01/19/2010 which demonstrated normal left ventricular systolic function, normal regional wall motion, mild LVH, moderate left atrial enlargement, mild mitral regurgitation, mild aortic sclerosis, and diastolic function could not be assessed as the patient was in atrial fibrillation. However, she told me that she had an echocardiogram performed approximately 2-3 weeks ago with [facility]. She was told that she may have had a heart attack in the past based on these results. She denies chest pain, shortness of breath, and palpitations. Her leg swelling has been under good control. 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 She will undergo radiation therapy as well. The chemotherapy has made her feel "lousy" and she has had a diminished appetite as well. She is here with her sister.  ECG performed in the office today demonstrates atrial fibrillation, heart rate 104 bpm, isolated PVC.  Wt 252 lbs  Review of Systems: As per "subjective", otherwise negative.  Allergies  Allergen Reactions  . Fexofenadine Hcl   . Penicillins     Current Outpatient Prescriptions  Medication Sig Dispense Refill  . allopurinol (ZYLOPRIM) 300 MG tablet TAKE 1 TABLET EVERY DAY  60 tablet  3  . ALPRAZolam (XANAX) 0.5 MG tablet TAKE 1 TABLET 3 TIMES A DAY AS NEEDED  90 tablet  5  . Ascorbic  Acid (VITAMIN C) 1000 MG tablet Take 1,000 mg by mouth daily.      Marland Kitchen atorvastatin (LIPITOR) 10 MG tablet TAKE 1 TABLET BY MOUTH EVERY DAY AS DIRECTED  90 tablet  0  . BIOTIN PO Take by mouth.      . Cetirizine HCl (ZYRTEC PO) Take by mouth daily.        Mariane Baumgarten Calcium (STOOL SOFTENER PO) Take by mouth daily.        . ferrous sulfate 325 (65 FE) MG tablet Take 325 mg by mouth daily.       . furosemide (LASIX) 40 MG tablet Take 1 tablet (40 mg total) by mouth 2 (two) times daily.  60 tablet  5  . levothyroxine (SYNTHROID, LEVOTHROID) 75 MCG tablet Take 75 mcg by mouth daily.        . meclizine (ANTIVERT) 25 MG tablet Take 25 mg by mouth as needed.      . metFORMIN (GLUCOPHAGE) 500 MG tablet Take 500 mg by mouth daily with breakfast.      . Multiple Vitamin (MULTIVITAMIN PO) Take by mouth daily.        Marland Kitchen nystatin-triamcinolone (MYCOLOG II) cream as needed.      Marland Kitchen omeprazole (PRILOSEC) 20 MG capsule Take 20 mg by mouth daily.      . propranolol (INDERAL) 40 MG tablet Take 1 tablet (40 mg total) by mouth 2 (two) times daily.      . ramipril (ALTACE) 5 MG capsule TAKE ONE CAPSULE EVERY DAY (NEEDS OFFICE VISIT)  90 capsule  0  . warfarin (  COUMADIN) 5 MG tablet TAKE 1 TABLET BY MOUTH DAILY EXCEPT TAKE 1.5 TABLETS ON MONDAYS, WEDNESDAYS, AND FRIDAYS  45 tablet  3  . Zinc 50 MG TABS Take 50 mg by mouth.       No current facility-administered medications for this visit.    Past Medical History  Diagnosis Date  . Chronic atrial fibrillation   . Hypertension   . Exogenous obesity   . Hypothyroidism   . Hyperlipidemia   . Hyperuricemia   . Osteoarthritis   . 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 US echocardiography  2011    EF 55-60% mild LVH, mod. Left Atria lEnlargement, mild AS,mild MR  . Tubal ligation  1982  . Oophorectomy  1976    right    History   Social History  . Marital Status: Married    Spouse Name: N/A    Number  of Children: N/A  . Years of Education: N/A   Occupational History  . Not on file.   Social History Main Topics  . Smoking status: Never Smoker   . Smokeless tobacco: Never Used  . Alcohol Use: Not on file  . Drug Use: Not on file  . Sexual Activity: Not on file   Other Topics Concern  . Not on file   Social History Narrative  . No narrative on file     Filed Vitals:   06/04/14 0944  Height: 6\' 1"  (1.854 m)  Weight: 252 lb (114.306 kg)   BP 126/75  Pulse 72   PHYSICAL EXAM General: NAD HEENT: Normal. Neck: No JVD, mild thyromegaly. Lungs: Clear to auscultation bilaterally with normal respiratory effort. CV: Nondisplaced PMI.  Irregular rhythm, normal S1/S2, no S3, no murmur. Wearing compression stockings with trace pretibial and periankle edema.  No carotid bruit. Abdomen: Soft, nontender, no hepatosplenomegaly, no distention.  Neurologic: Alert and oriented x 3.  Psych: Normal affect. Skin: Normal. Musculoskeletal: Normal range of motion, no gross deformities. Extremities: No clubbing or cyanosis.   ECG: Most recent ECG reviewed.   ASSESSMENT AND PLAN: 1. Chronic atrial fibrillation: Heart rate is slightly elevated. In order to avoid the development of a tachycardia mediated myopathy, I will increase her a.m. dose of propranolol to 80 mg and continue 40 mg in the evening. I will obtain a copy of her most recent echocardiogram. Continue warfarin for anticoagulation. 2. Essential HTN: Well controlled with ramipril 5 mg daily and propranolol. No changes to therapy. 3. Hyperlipidemia: HDL 35, LDL 67 on 02/28/2014. No changes to therapy. Continue Lipitor 10 mg daily. 4. Type 2 diabetes mellitus: Well controlled on current therapy. HbA1c 6.1% on 02/28/2014. 5. Chronic lymphedema: Well controlled with Lasix 40 mg twice daily and compression stocking use. No changes to therapy. 6. Chronic diastolic heart failure: Euvolemic and stable. Continue Lasix 40 mg twice daily. Will  obtain most recent echocardiogram report.  Dispo: f/u in 4-6 weeks for ECG to confirm adequate HR control in setting of atrial fibrillation. F/u with me in 6 months.  Kate Sable, M.D., F.A.C.C.

## 2014-06-11 ENCOUNTER — Other Ambulatory Visit: Payer: Self-pay | Admitting: *Deleted

## 2014-06-11 MED ORDER — RAMIPRIL 5 MG PO CAPS: 5.0000 mg | ORAL_CAPSULE | Freq: Every day | ORAL | Status: AC

## 2014-06-22 ENCOUNTER — Other Ambulatory Visit: Payer: Self-pay | Admitting: Cardiology

## 2014-06-25 ENCOUNTER — Telehealth: Payer: Self-pay | Admitting: *Deleted

## 2014-06-25 ENCOUNTER — Ambulatory Visit (INDEPENDENT_AMBULATORY_CARE_PROVIDER_SITE_OTHER): Payer: Medicare Other | Admitting: *Deleted

## 2014-06-25 DIAGNOSIS — I4891 Unspecified atrial fibrillation: Secondary | ICD-10-CM

## 2014-06-25 DIAGNOSIS — Z7901 Long term (current) use of anticoagulants: Secondary | ICD-10-CM

## 2014-06-25 DIAGNOSIS — Z5181 Encounter for therapeutic drug level monitoring: Secondary | ICD-10-CM

## 2014-06-25 LAB — POCT INR

## 2014-06-25 LAB — PROTIME-INR: INR: 6.5 — AB (ref 0.9–1.1)

## 2014-06-25 NOTE — Telephone Encounter (Signed)
Sharon Roy called requesting her PT/INR results

## 2014-06-25 NOTE — Telephone Encounter (Signed)
See coumadin note. 

## 2014-07-02 ENCOUNTER — Telehealth: Payer: Self-pay

## 2014-07-02 NOTE — Telephone Encounter (Signed)
Patient died @ [facility] per Iver Nestle

## 2014-07-09 ENCOUNTER — Ambulatory Visit: Payer: Self-pay | Admitting: *Deleted

## 2014-07-09 DIAGNOSIS — Z5181 Encounter for therapeutic drug level monitoring: Secondary | ICD-10-CM

## 2014-07-09 DIAGNOSIS — I4891 Unspecified atrial fibrillation: Secondary | ICD-10-CM

## 2014-07-09 DEATH — deceased

## 2014-07-24 ENCOUNTER — Other Ambulatory Visit: Payer: Medicare Other
